# Patient Record
Sex: Female | Born: 1975 | Race: White | Hispanic: No | Marital: Married | Smoking: Never smoker
Health system: Southern US, Community
[De-identification: ages and names within clinical notes are randomized; demographics above are authoritative.]

## PROBLEM LIST (undated history)

## (undated) DIAGNOSIS — Z87440 Personal history of urinary (tract) infections: Secondary | ICD-10-CM

## (undated) DIAGNOSIS — C801 Malignant (primary) neoplasm, unspecified: Secondary | ICD-10-CM

## (undated) DIAGNOSIS — C439 Malignant melanoma of skin, unspecified: Secondary | ICD-10-CM

## (undated) HISTORY — DX: Personal history of urinary (tract) infections: Z87.440

## (undated) HISTORY — DX: Malignant (primary) neoplasm, unspecified: C80.1

---

## 1995-02-27 HISTORY — PX: WISDOM TOOTH EXTRACTION: SHX21

## 2005-10-17 ENCOUNTER — Inpatient Hospital Stay: Payer: Self-pay | Admitting: Obstetrics and Gynecology

## 2006-09-29 ENCOUNTER — Ambulatory Visit: Payer: Self-pay | Admitting: Emergency Medicine

## 2006-12-05 ENCOUNTER — Ambulatory Visit: Payer: Self-pay | Admitting: Internal Medicine

## 2007-02-27 HISTORY — PX: TONSILLECTOMY AND ADENOIDECTOMY: SHX28

## 2007-03-03 ENCOUNTER — Ambulatory Visit: Payer: Self-pay | Admitting: Otolaryngology

## 2008-02-27 DIAGNOSIS — C801 Malignant (primary) neoplasm, unspecified: Secondary | ICD-10-CM

## 2008-02-27 HISTORY — PX: EXCISION MELANOMA WITH SENTINEL LYMPH NODE BIOPSY: SHX5628

## 2008-02-27 HISTORY — DX: Malignant (primary) neoplasm, unspecified: C80.1

## 2008-08-16 DIAGNOSIS — C439 Malignant melanoma of skin, unspecified: Secondary | ICD-10-CM

## 2008-08-16 HISTORY — DX: Malignant melanoma of skin, unspecified: C43.9

## 2009-01-24 DIAGNOSIS — D239 Other benign neoplasm of skin, unspecified: Secondary | ICD-10-CM

## 2009-01-24 HISTORY — DX: Other benign neoplasm of skin, unspecified: D23.9

## 2009-09-07 DIAGNOSIS — D239 Other benign neoplasm of skin, unspecified: Secondary | ICD-10-CM

## 2009-09-07 HISTORY — DX: Other benign neoplasm of skin, unspecified: D23.9

## 2010-05-31 ENCOUNTER — Ambulatory Visit: Payer: Self-pay | Admitting: Surgery

## 2012-02-28 LAB — HM PAP SMEAR: HM PAP: NORMAL

## 2013-03-10 DIAGNOSIS — C4491 Basal cell carcinoma of skin, unspecified: Secondary | ICD-10-CM

## 2013-03-10 HISTORY — DX: Basal cell carcinoma of skin, unspecified: C44.91

## 2013-04-15 ENCOUNTER — Ambulatory Visit: Payer: Self-pay | Admitting: Physician Assistant

## 2014-06-02 ENCOUNTER — Encounter: Payer: Self-pay | Admitting: *Deleted

## 2014-09-28 ENCOUNTER — Ambulatory Visit (INDEPENDENT_AMBULATORY_CARE_PROVIDER_SITE_OTHER): Payer: 59 | Admitting: Nurse Practitioner

## 2014-09-28 ENCOUNTER — Encounter: Payer: Self-pay | Admitting: Nurse Practitioner

## 2014-09-28 ENCOUNTER — Encounter (INDEPENDENT_AMBULATORY_CARE_PROVIDER_SITE_OTHER): Payer: Self-pay

## 2014-09-28 VITALS — BP 110/68 | HR 73 | Temp 98.4°F | Resp 14 | Ht 63.5 in | Wt 157.8 lb

## 2014-09-28 DIAGNOSIS — Z85820 Personal history of malignant melanoma of skin: Secondary | ICD-10-CM

## 2014-09-28 DIAGNOSIS — Z7189 Other specified counseling: Secondary | ICD-10-CM

## 2014-09-28 DIAGNOSIS — Z0000 Encounter for general adult medical examination without abnormal findings: Secondary | ICD-10-CM | POA: Diagnosis not present

## 2014-09-28 DIAGNOSIS — Z7689 Persons encountering health services in other specified circumstances: Secondary | ICD-10-CM

## 2014-09-28 DIAGNOSIS — Z719 Counseling, unspecified: Secondary | ICD-10-CM | POA: Insufficient documentation

## 2014-09-28 NOTE — Assessment & Plan Note (Signed)
Will obtain records from previous facility

## 2014-09-28 NOTE — Assessment & Plan Note (Signed)
Discussed acute and chronic issues. Reviewed health maintenance measures, PFSHx, and immunizations. Obtain future fasting routine labs TSH, Lipid panel, CBC w/ diff, A1c, and CMET. Obtain records from previous facility.   No need for PAP today. Will obtain in 2017

## 2014-09-28 NOTE — Progress Notes (Signed)
Subjective:    Patient ID: Christine Petty, female    DOB: 1975/07/12, 38 y.o.   MRN: [id number]  HPI  Christine Petty is a 39 yo female establishing care today. No complaints.   1) New pt info:   Immunizations- Unknown dates, works at [employer], not keeping OB/GYN   LMP- 08/2014   Eye Exam- 2015 Dec.   Dental Exam- UTD  2) Chronic Problems-  Skin Cancer- 2010 melanoma, every 6 months check up with Dr. Nehemiah Massed   3) Acute Problems- No CC   Review of Systems  Constitutional: Negative for fever, chills, diaphoresis and fatigue.  Respiratory: Negative for chest tightness, shortness of breath and wheezing.   Cardiovascular: Negative for chest pain, palpitations and leg swelling.  Gastrointestinal: Negative for nausea, vomiting and diarrhea.  Skin: Negative for rash.  Neurological: Negative for dizziness, weakness, numbness and headaches.  Psychiatric/Behavioral: Negative for suicidal ideas and sleep disturbance. The patient is not nervous/anxious.    Past Medical History  Diagnosis Date  . Cancer 2010    Skin Cancer  . History of frequent urinary tract infections     History   Social History  . Marital Status: Married    Spouse Name: N/A  . Number of Children: N/A  . Years of Education: N/A   Occupational History  . Not on file.   Social History Main Topics  . Smoking status: Never Smoker   . Smokeless tobacco: Never Used  . Alcohol Use: 0.0 oz/week    0 Standard drinks or equivalent per week     Comment: Rare  . Drug Use: No  . Sexual Activity:    Partners: Male     Comment: Vasectomy- husband   Other Topics Concern  . Not on file   Social History Narrative   Works [employer] as an Therapist, sports    Lives with husband and son (77 yo)    Pets: 2 dogs live inside   Caffeine- 1 soda every other day                        Past Surgical History  Procedure Laterality Date  . Tonsillectomy and adenoidectomy  2009  . Excision melanoma with sentinel lymph node biopsy  2010    . Cesarean section  2007    Family History  Problem Relation Age of Onset  . Cancer Mother     Breast Cancer  . Hyperlipidemia Mother   . Hypertension Mother   . Hyperlipidemia Father   . Hypertension Father     No Known Allergies  No current outpatient prescriptions on file prior to visit.   No current facility-administered medications on file prior to visit.      Objective:   Physical Exam  Constitutional: She is oriented to person, place, and time. She appears well-developed and well-nourished. No distress.  BP 110/68 mmHg  Pulse 73  Temp(Src) 98.4 F (36.9 C)  Resp 14  Ht 5' 3.5" (1.613 m)  Wt 157 lb 12.8 oz (71.578 kg)  BMI 27.51 kg/m2  SpO2 97%   HENT:  Head: Normocephalic and atraumatic.  Right Ear: External ear normal.  Left Ear: External ear normal.  Cardiovascular: Normal rate, regular rhythm, normal heart sounds and intact distal pulses.  Exam reveals no gallop and no friction rub.   No murmur heard. Pulmonary/Chest: Effort normal and breath sounds normal. No respiratory distress. She has no wheezes. She has no rales. She exhibits no  tenderness.  Neurological: She is alert and oriented to person, place, and time. No cranial nerve deficit. She exhibits normal muscle tone. Coordination normal.  Skin: Skin is warm and dry. No rash noted. She is not diaphoretic.  Psychiatric: She has a normal mood and affect. Her behavior is normal. Judgment and thought content normal.      Assessment & Plan:

## 2014-09-28 NOTE — Patient Instructions (Addendum)
Welcome to [facility]! Nice to meet you!   Please make a fasting lab appointment before leaving today.   Follow up in 1 year or sooner as needed.

## 2014-09-28 NOTE — Addendum Note (Signed)
Addended by: Rubbie Battiest on: 09/28/2014 04:56 PM   Modules accepted: Level of Service

## 2014-09-28 NOTE — Assessment & Plan Note (Signed)
Sees Dermatology- Dr. Nehemiah Massed every 6 months. No recent concerns. Will follow.

## 2014-09-28 NOTE — Progress Notes (Signed)
Pre visit review using our clinic review tool, if applicable. No additional management support is needed unless otherwise documented below in the visit note. 

## 2014-10-08 ENCOUNTER — Other Ambulatory Visit (INDEPENDENT_AMBULATORY_CARE_PROVIDER_SITE_OTHER): Payer: 59

## 2014-10-08 DIAGNOSIS — Z0000 Encounter for general adult medical examination without abnormal findings: Secondary | ICD-10-CM

## 2014-10-08 LAB — HEMOGLOBIN A1C: Hgb A1c MFr Bld: 5.1 % (ref 4.6–6.5)

## 2014-10-08 LAB — CBC WITH DIFFERENTIAL/PLATELET
BASOS PCT: 0.5 % (ref 0.0–3.0)
Basophils Absolute: 0 10*3/uL (ref 0.0–0.1)
EOS ABS: 0 10*3/uL (ref 0.0–0.7)
Eosinophils Relative: 0.6 % (ref 0.0–5.0)
HCT: 38.8 % (ref 36.0–46.0)
Hemoglobin: 13.2 g/dL (ref 12.0–15.0)
LYMPHS ABS: 2.2 10*3/uL (ref 0.7–4.0)
Lymphocytes Relative: 33.1 % (ref 12.0–46.0)
MCHC: 34.1 g/dL (ref 30.0–36.0)
MCV: 95.3 fl (ref 78.0–100.0)
MONO ABS: 0.4 10*3/uL (ref 0.1–1.0)
Monocytes Relative: 6 % (ref 3.0–12.0)
NEUTROS ABS: 4 10*3/uL (ref 1.4–7.7)
Neutrophils Relative %: 59.8 % (ref 43.0–77.0)
Platelets: 277 10*3/uL (ref 150.0–400.0)
RBC: 4.07 Mil/uL (ref 3.87–5.11)
RDW: 12.7 % (ref 11.5–15.5)
WBC: 6.7 10*3/uL (ref 4.0–10.5)

## 2014-10-08 LAB — COMPREHENSIVE METABOLIC PANEL
ALBUMIN: 4.3 g/dL (ref 3.5–5.2)
ALK PHOS: 47 U/L (ref 39–117)
ALT: 11 U/L (ref 0–35)
AST: 18 U/L (ref 0–37)
BILIRUBIN TOTAL: 2.1 mg/dL — AB (ref 0.2–1.2)
BUN: 16 mg/dL (ref 6–23)
CALCIUM: 9.3 mg/dL (ref 8.4–10.5)
CO2: 25 mEq/L (ref 19–32)
CREATININE: 0.65 mg/dL (ref 0.40–1.20)
Chloride: 105 mEq/L (ref 96–112)
GFR: 107.62 mL/min (ref >60.00)
Glucose, Bld: 86 mg/dL (ref 70–99)
Potassium: 4.4 mEq/L (ref 3.5–5.1)
Sodium: 138 mEq/L (ref 135–145)
Total Protein: 7.2 g/dL (ref 6.0–8.3)

## 2014-10-08 LAB — LIPID PANEL
Cholesterol: 186 mg/dL (ref 0–200)
HDL: 59.7 mg/dL (ref >39.00)
LDL Cholesterol: 115 mg/dL — ABNORMAL HIGH (ref 0–99)
NONHDL: 125.98
Total CHOL/HDL Ratio: 3
Triglycerides: 57 mg/dL (ref 0.0–149.0)
VLDL: 11.4 mg/dL (ref 0.0–40.0)

## 2014-10-08 LAB — TSH: TSH: 1.09 u[IU]/mL (ref 0.35–4.50)

## 2015-05-25 DIAGNOSIS — D485 Neoplasm of uncertain behavior of skin: Secondary | ICD-10-CM | POA: Diagnosis not present

## 2015-05-25 DIAGNOSIS — L820 Inflamed seborrheic keratosis: Secondary | ICD-10-CM | POA: Diagnosis not present

## 2015-05-25 DIAGNOSIS — D225 Melanocytic nevi of trunk: Secondary | ICD-10-CM | POA: Diagnosis not present

## 2015-05-25 DIAGNOSIS — L309 Dermatitis, unspecified: Secondary | ICD-10-CM | POA: Diagnosis not present

## 2015-05-25 DIAGNOSIS — Z85820 Personal history of malignant melanoma of skin: Secondary | ICD-10-CM | POA: Diagnosis not present

## 2015-05-25 DIAGNOSIS — Z1283 Encounter for screening for malignant neoplasm of skin: Secondary | ICD-10-CM | POA: Diagnosis not present

## 2015-05-25 DIAGNOSIS — Z85828 Personal history of other malignant neoplasm of skin: Secondary | ICD-10-CM | POA: Diagnosis not present

## 2015-11-04 ENCOUNTER — Ambulatory Visit
Admission: EM | Admit: 2015-11-04 | Discharge: 2015-11-04 | Disposition: A | Discharge status: Home / Self Care | Payer: 59 | Attending: Family Medicine | Admitting: Family Medicine

## 2015-11-04 DIAGNOSIS — H109 Unspecified conjunctivitis: Secondary | ICD-10-CM | POA: Diagnosis not present

## 2015-11-04 HISTORY — DX: Malignant melanoma of skin, unspecified: C43.9

## 2015-11-04 MED ORDER — KETOTIFEN FUMARATE 0.025 % OP SOLN: 1.0000 [drp] | Freq: Two times a day (BID) | OPHTHALMIC | 0 refills | Status: AC

## 2015-11-04 MED ORDER — MOXIFLOXACIN HCL 0.5 % OP SOLN: 1.0000 [drp] | Freq: Three times a day (TID) | OPHTHALMIC | 0 refills | Status: DC

## 2015-11-04 NOTE — ED Triage Notes (Signed)
Left eye 20/25 corrected Right eye 20/25 corrected Both eyes 20/20 corrected

## 2015-11-04 NOTE — ED Provider Notes (Signed)
CSN: [id number]     Arrival date & time 11/04/15  [id number] History   First MD Initiated Contact with Patient 11/04/15 [phone]     Chief Complaint  Patient presents with  . 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 She does wear contact lenses. He has a feeling of grittiness in her eye and has had a clear type discharge. Visual disturbances and has not have any photosensitivity. She does not exhibit squinting. States that she did not wear her contacts this morning and is wearing normal eyewear. He states that she removes her contacts nightly and cleans them every single night without fail.      Past Medical History:  Diagnosis Date  . Cancer ([place]) 2010   Skin Cancer  . History of frequent urinary tract infections   . Melanoma [facility])    Past Surgical History:  Procedure Laterality Date  . CESAREAN SECTION  2007  . EXCISION MELANOMA WITH SENTINEL LYMPH NODE BIOPSY  2010  . TONSILLECTOMY AND ADENOIDECTOMY  2009   Family History  Problem Relation Age of Onset  . Cancer Mother     Breast Cancer  . Hyperlipidemia Mother   . Hypertension Mother   . Hyperlipidemia Father   . Hypertension Father    Social History  Substance Use Topics  . Smoking status: Never Smoker  . Smokeless tobacco: Never Used  . Alcohol use 0.0 oz/week     Comment: Rare   OB History    No data available     Review of Systems  Constitutional: Negative for activity change, chills, fatigue and fever.  HENT: Negative for congestion.   Eyes: Positive for discharge, redness and itching. Negative for photophobia and visual disturbance.  All other systems reviewed and are negative.   Allergies  Review of patient's allergies indicates no known allergies.  Home Medications   Prior to Admission medications   Medication Sig  Start Date End Date Taking? Authorizing Provider  ketotifen (ZADITOR) 0.025 % ophthalmic solution Place 1 drop into both eyes 2 (two) times daily. 11/04/15 11/11/15  Lorin Picket, PA-C  moxifloxacin (VIGAMOX) 0.5 % ophthalmic solution Place 1 drop into the right eye 3 (three) times daily. 11/04/15   Lorin Picket, PA-C   Meds Ordered and Administered this Visit  Medications - No data to display  BP (!) 109/59 (BP Location: Left Arm)   Pulse 68   Temp 98.7 F (37.1 C) (Oral)   Resp 16   Ht 5\' 3"  (1.6 m)   Wt 155 lb (70.3 kg)   LMP 10/24/2015   SpO2 99%   BMI 27.46 kg/m  No data found.   Physical Exam  Constitutional: She is oriented to person, place, and time. She appears well-developed and well-nourished. No distress.  HENT:  Head: Normocephalic and atraumatic.  Eyes: EOM are normal. Pupils are equal, round, and reactive to light. Right eye exhibits discharge. Left eye exhibits no discharge. No scleral icterus.  Examination of the eyes shows visual acuity to be normal with correction. Pupils were equal and reactive to light. EOMs are full and intact. The right eye conjunctiva is inflamed. She has mild amount of clear discharge present. The right eyelid was everted with the findings of no foreign bodies. With her permission of tetracaine was utilized to anesthetize the eye and fluorscein stain was placed. Clamp was  then utilized to examine the eye showing no apparent corneal abrasions.  Musculoskeletal: Normal range of motion.  Neurological: She is alert and oriented to person, place, and time.  Skin: Skin is warm and dry. She is not diaphoretic.  Psychiatric: She has a normal mood and affect. Her behavior is normal. Judgment and thought content normal.  Nursing note and vitals reviewed.   Urgent Care Course   Clinical Course    Procedures (including critical care time)  Labs Review Labs Reviewed - No data to display  Imaging Review No results found.   Visual Acuity  Review  Right Eye Distance:  (20/25 corrected) Left Eye Distance:  (20/25 corrected) Bilateral Distance:  (20/20 corrected)  Right Eye Near:   Left Eye Near:    Bilateral Near:         MDM   1. Conjunctivitis of right eye    Discharge Medication List as of 11/04/2015  9:11 AM    START taking these medications   Details  ketotifen (ZADITOR) 0.025 % ophthalmic solution Place 1 drop into both eyes 2 (two) times daily., Starting Fri 11/04/2015, Until Fri 11/11/2015, Normal    moxifloxacin (VIGAMOX) 0.5 % ophthalmic solution Place 1 drop into the right eye 3 (three) times daily., Starting Fri 11/04/2015, Normal      Plan: 1. Test/x-ray results and diagnosis reviewed with patient 2. rx as per orders; risks, benefits, potential side effects reviewed with patient 3. Recommend supportive treatment with cool Compresses for comfort. I recommended not using contacts for the next 7 days. She's not improved in 2 days she should see the ophthalmologist at [facility]. Because of her contact eyewear usage I have placed her on antibacterial eyedrops along with antihistamine/ mast cell stabilizer eyedrops. Is given a work note for today. 4. F/u prn if symptoms worsen or don't improve     Lorin Picket, PA-C 11/04/15 [phone]

## 2015-11-04 NOTE — ED Triage Notes (Signed)
Pt reports right eye sclera redness and burning starting last night. Clear drainage from eye and feels "gritty"

## 2015-11-28 DIAGNOSIS — Z85828 Personal history of other malignant neoplasm of skin: Secondary | ICD-10-CM | POA: Diagnosis not present

## 2015-11-28 DIAGNOSIS — D485 Neoplasm of uncertain behavior of skin: Secondary | ICD-10-CM | POA: Diagnosis not present

## 2015-11-28 DIAGNOSIS — Z85820 Personal history of malignant melanoma of skin: Secondary | ICD-10-CM | POA: Diagnosis not present

## 2015-11-28 DIAGNOSIS — L820 Inflamed seborrheic keratosis: Secondary | ICD-10-CM | POA: Diagnosis not present

## 2015-11-28 DIAGNOSIS — L578 Other skin changes due to chronic exposure to nonionizing radiation: Secondary | ICD-10-CM | POA: Diagnosis not present

## 2015-11-28 DIAGNOSIS — L812 Freckles: Secondary | ICD-10-CM | POA: Diagnosis not present

## 2015-11-28 DIAGNOSIS — D225 Melanocytic nevi of trunk: Secondary | ICD-10-CM | POA: Diagnosis not present

## 2015-11-28 DIAGNOSIS — L309 Dermatitis, unspecified: Secondary | ICD-10-CM | POA: Diagnosis not present

## 2015-11-28 DIAGNOSIS — D2271 Melanocytic nevi of right lower limb, including hip: Secondary | ICD-10-CM | POA: Diagnosis not present

## 2015-11-28 DIAGNOSIS — Z1283 Encounter for screening for malignant neoplasm of skin: Secondary | ICD-10-CM | POA: Diagnosis not present

## 2015-11-28 DIAGNOSIS — D229 Melanocytic nevi, unspecified: Secondary | ICD-10-CM | POA: Diagnosis not present

## 2015-12-12 ENCOUNTER — Other Ambulatory Visit (HOSPITAL_COMMUNITY)
Admission: RE | Admit: 2015-12-12 | Discharge: 2015-12-12 | Disposition: A | Discharge status: Home / Self Care | Payer: 59 | Source: Ambulatory Visit | Attending: Family | Admitting: Family

## 2015-12-12 ENCOUNTER — Encounter: Payer: Self-pay | Admitting: Family

## 2015-12-12 ENCOUNTER — Ambulatory Visit (INDEPENDENT_AMBULATORY_CARE_PROVIDER_SITE_OTHER): Payer: 59 | Admitting: Family

## 2015-12-12 VITALS — BP 130/88 | HR 76 | Ht 63.25 in | Wt 155.0 lb

## 2015-12-12 DIAGNOSIS — Z0000 Encounter for general adult medical examination without abnormal findings: Secondary | ICD-10-CM | POA: Diagnosis not present

## 2015-12-12 DIAGNOSIS — Z85820 Personal history of malignant melanoma of skin: Secondary | ICD-10-CM

## 2015-12-12 DIAGNOSIS — Z1151 Encounter for screening for human papillomavirus (HPV): Secondary | ICD-10-CM | POA: Diagnosis not present

## 2015-12-12 DIAGNOSIS — Z01411 Encounter for gynecological examination (general) (routine) with abnormal findings: Secondary | ICD-10-CM | POA: Insufficient documentation

## 2015-12-12 LAB — COMPREHENSIVE METABOLIC PANEL
ALBUMIN: 4.3 g/dL (ref 3.5–5.2)
ALT: 12 U/L (ref 0–35)
AST: 16 U/L (ref 0–37)
Alkaline Phosphatase: 43 U/L (ref 39–117)
BUN: 15 mg/dL (ref 6–23)
CALCIUM: 9.5 mg/dL (ref 8.4–10.5)
CHLORIDE: 107 meq/L (ref 96–112)
CO2: 27 meq/L (ref 19–32)
CREATININE: 0.83 mg/dL (ref 0.40–1.20)
GFR: 80.68 mL/min (ref >60.00)
Glucose, Bld: 94 mg/dL (ref 70–99)
POTASSIUM: 4.2 meq/L (ref 3.5–5.1)
SODIUM: 139 meq/L (ref 135–145)
Total Bilirubin: 0.9 mg/dL (ref 0.2–1.2)
Total Protein: 7.2 g/dL (ref 6.0–8.3)

## 2015-12-12 LAB — CBC WITH DIFFERENTIAL/PLATELET
BASOS PCT: 0.5 % (ref 0.0–3.0)
Basophils Absolute: 0 10*3/uL (ref 0.0–0.1)
EOS ABS: 0.1 10*3/uL (ref 0.0–0.7)
EOS PCT: 0.8 % (ref 0.0–5.0)
HCT: 38.4 % (ref 36.0–46.0)
Hemoglobin: 13.1 g/dL (ref 12.0–15.0)
Lymphocytes Relative: 30 % (ref 12.0–46.0)
Lymphs Abs: 2.2 10*3/uL (ref 0.7–4.0)
MCHC: 34.2 g/dL (ref 30.0–36.0)
MCV: 95.3 fl (ref 78.0–100.0)
MONO ABS: 0.5 10*3/uL (ref 0.1–1.0)
Monocytes Relative: 7.2 % (ref 3.0–12.0)
NEUTROS ABS: 4.4 10*3/uL (ref 1.4–7.7)
Neutrophils Relative %: 61.5 % (ref 43.0–77.0)
PLATELETS: 270 10*3/uL (ref 150.0–400.0)
RBC: 4.03 Mil/uL (ref 3.87–5.11)
RDW: 12.9 % (ref 11.5–15.5)
WBC: 7.2 10*3/uL (ref 4.0–10.5)

## 2015-12-12 LAB — LIPID PANEL
CHOLESTEROL: 163 mg/dL (ref 0–200)
HDL: 52.2 mg/dL (ref >39.00)
LDL CALC: 94 mg/dL (ref 0–99)
NonHDL: 110.44
Total CHOL/HDL Ratio: 3
Triglycerides: 82 mg/dL (ref 0.0–149.0)
VLDL: 16.4 mg/dL (ref 0.0–40.0)

## 2015-12-12 LAB — VITAMIN D 25 HYDROXY (VIT D DEFICIENCY, FRACTURES): VITD: 19.06 ng/mL — AB (ref 30.00–100.00)

## 2015-12-12 LAB — HEMOGLOBIN A1C: HEMOGLOBIN A1C: 4.9 % (ref 4.6–6.5)

## 2015-12-12 LAB — TSH: TSH: 1.29 u[IU]/mL (ref 0.35–4.50)

## 2015-12-12 NOTE — Progress Notes (Signed)
Subjective:    Patient ID: Christine Petty, female    DOB: 1975-04-02, 40 y.o.   MRN: [id number]  CC: Christine Petty is a 40 y.o. female who presents today for physical exam.    HPI: Patient here to establish care and for routine physical. Feeling well.  History of melanoma, follows with Dr. Nehemiah Massed  Every 6 months.        Colorectal Cancer Screening: No strong family history.Will defer at this time.  Breast Cancer Screening: Due Cervical Cancer Screening: Due.  Bone Health screening/DEXA for 65+: No increased fracture risk. Defer screening at this time.  Immunizations- no records. Will do Tdap at work.        Tetanus - Due        HIV Screening- Candidate for.  Labs: Screening labs today. Exercise: Gets regular exercise.  Alcohol use:  Rare Smoking/tobacco use: Nonsmoker.  Regular dental exams: UTD Wears seat belt: Yes.  HISTORY:  Past Medical History:  Diagnosis Date  . Cancer ([place]) 2010   Skin Cancer  . History of frequent urinary tract infections   . Melanoma [facility])     Past Surgical History:  Procedure Laterality Date  . CESAREAN SECTION  2007  . EXCISION MELANOMA WITH SENTINEL LYMPH NODE BIOPSY  2010  . TONSILLECTOMY AND ADENOIDECTOMY  2009   Family History  Problem Relation Age of Onset  . Cancer Mother 34    Breast Cancer  . Hyperlipidemia Mother   . Hypertension Mother   . Hyperlipidemia Father   . Hypertension Father       ALLERGIES: Review of patient's allergies indicates no known allergies.  No current outpatient prescriptions on file prior to visit.   No current facility-administered medications on file prior to visit.     Social History  Substance Use Topics  . Smoking status: Never Smoker  . Smokeless tobacco: Never Used  . Alcohol use 0.0 oz/week     Comment: Rare    Review of Systems  Constitutional: Negative for chills, fever and unexpected weight change.  HENT: Negative for congestion.   Respiratory: Negative for cough.     Cardiovascular: Negative for chest pain, palpitations and leg swelling.  Gastrointestinal: Negative for nausea and vomiting.  Musculoskeletal: Negative for arthralgias and myalgias.  Skin: Negative for rash.  Neurological: Negative for headaches.  Hematological: Negative for adenopathy.  Psychiatric/Behavioral: Negative for confusion.      Objective:    BP 130/88   Pulse 76   Ht 5' 3.25" (1.607 m)   Wt 155 lb (70.3 kg)   SpO2 99%   BMI 27.24 kg/m   BP Readings from Last 3 Encounters:  12/12/15 130/88  11/04/15 (!) 109/59  09/28/14 110/68   Wt Readings from Last 3 Encounters:  12/12/15 155 lb (70.3 kg)  11/04/15 155 lb (70.3 kg)  09/28/14 157 lb 12.8 oz (71.6 kg)    Physical Exam  Constitutional: She appears well-developed and well-nourished.  Eyes: Conjunctivae are normal.  Neck: No thyroid mass and no thyromegaly present.    Scar noted.  Cardiovascular: Normal rate, regular rhythm, normal heart sounds and normal pulses.   Pulmonary/Chest: Effort normal and breath sounds normal. She has no wheezes. She has no rhonchi. She has no rales. Right breast exhibits no inverted nipple, no mass, no nipple discharge, no skin change and no tenderness. Left breast exhibits no inverted nipple, no mass, no nipple discharge, no skin change and no tenderness. Breasts are symmetrical.  No masses  or asymmetry appreciated during CBE.  Genitourinary: Uterus is not enlarged, not fixed and not tender. Cervix exhibits no motion tenderness, no discharge and no friability. Right adnexum displays no mass, no tenderness and no fullness. Left adnexum displays no mass, no tenderness and no fullness.  Genitourinary Comments: Pap performed. No CMT. Unable to appreciated ovaries.  Lymphadenopathy:       Head (right side): No submental, no submandibular, no tonsillar, no preauricular, no posterior auricular and no occipital adenopathy present.       Head (left side): No submental, no submandibular, no  tonsillar, no preauricular, no posterior auricular and no occipital adenopathy present.       Right cervical: No superficial cervical, no deep cervical and no posterior cervical adenopathy present.      Left cervical: No superficial cervical, no deep cervical and no posterior cervical adenopathy present.    She has no axillary adenopathy.       Right axillary: No pectoral and no lateral adenopathy present.       Left axillary: No pectoral and no lateral adenopathy present. Neurological: She is alert.  Skin: Skin is warm and dry.  Psychiatric: She has a normal mood and affect. Her speech is normal and behavior is normal. Thought content normal.  Vitals reviewed.      Assessment & Plan:   Problem List Items Addressed This Visit      Other   Routine general medical examination at a health care facility - Primary    Deferred colonoscopy, DEXA due to age and absence of risk factors. Ordered mammogram. Pap and CBE performed. Screening labs ordered including HIV. 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 Encouraged healthy diet and exercise.       Relevant Orders   CBC with Differential/Platelet (Completed)   Comprehensive metabolic panel (Completed)   Hemoglobin A1c (Completed)   Lipid panel (Completed)   Cytology - PAP   VITAMIN D 25 Hydroxy (Vit-D Deficiency, Fractures) (Completed)   TSH (Completed)   MM DIGITAL SCREENING BILATERAL   HIV antibody   History of melanoma    Stable. Follows with Dr. Broadus John.        Other Visit Diagnoses   None.      I have discontinued Ms. Mom's moxifloxacin.   No orders of the defined types were placed in this encounter.   Return precautions given.   Risks, benefits, and alternatives of the medications and treatment plan prescribed today were discussed, and patient expressed understanding.   Education regarding symptom management and diagnosis given to patient on AVS.   Continue to follow with Mable Paris, FNP  for routine health maintenance.   Christine Petty and I agreed with plan.   Mable Paris, FNP

## 2015-12-12 NOTE — Assessment & Plan Note (Signed)
Deferred colonoscopy, DEXA due to age and absence of risk factors. Ordered mammogram. Pap and CBE performed. Screening labs ordered including HIV. 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 Encouraged healthy diet and exercise.

## 2015-12-12 NOTE — Assessment & Plan Note (Signed)
Stable. Follows with Dr. Broadus John.

## 2015-12-12 NOTE — Patient Instructions (Addendum)
Pleasure seeing you.   Health Maintenance, Female Adopting a healthy lifestyle and getting preventive care can go a long way to promote health and wellness. Talk with your health care provider about what schedule of regular examinations is right for you. This is a good chance for you to check in with your provider about disease prevention and staying healthy. In between checkups, there are plenty of things you can do on your own. Experts have done a lot of research about which lifestyle changes and preventive measures are most likely to keep you healthy. Ask your health care provider for more information. WEIGHT AND DIET  Eat a healthy diet  Be sure to include plenty of vegetables, fruits, low-fat dairy products, and lean protein.  Do not eat a lot of foods high in solid fats, added sugars, or salt.  Get regular exercise. This is one of the most important things you can do for your health.  Most adults should exercise for at least 150 minutes each week. The exercise should increase your heart rate and make you sweat (moderate-intensity exercise).  Most adults should also do strengthening exercises at least twice a week. This is in addition to the moderate-intensity exercise.  Maintain a healthy weight  Body mass index (BMI) is a measurement that can be used to identify possible weight problems. It estimates body fat based on height and weight. Your health care provider can help determine your BMI and help you achieve or maintain a healthy weight.  For females 86 years of age and older:   A BMI below 18.5 is considered underweight.  A BMI of 18.5 to 24.9 is normal.  A BMI of 25 to 29.9 is considered overweight.  A BMI of 30 and above is considered obese.  Watch levels of cholesterol and blood lipids  You should start having your blood tested for lipids and cholesterol at 40 years of age, then have this test every 5 years.  You may need to have your cholesterol levels checked more  often if:  Your lipid or cholesterol levels are high.  You are older than 40 years of age.  You are at high risk for heart disease.  CANCER SCREENING   Lung Cancer  Lung cancer screening is recommended for adults 22-52 years old who are at high risk for lung cancer because of a history of smoking.  A yearly low-dose CT scan of the lungs is recommended for people who:  Currently smoke.  Have quit within the past 15 years.  Have at least a 30-pack-year history of smoking. A pack year is smoking an average of one pack of cigarettes a day for 1 year.  Yearly screening should continue until it has been 15 years since you quit.  Yearly screening should stop if you develop a health problem that would prevent you from having lung cancer treatment.  Breast Cancer  Practice breast self-awareness. This means understanding how your breasts normally appear and feel.  It also means doing regular breast self-exams. Let your health care provider know about any changes, no matter how small.  If you are in your 20s or 30s, you should have a clinical breast exam (CBE) by a health care provider every 1-3 years as part of a regular health exam.  If you are 58 or older, have a CBE every year. Also consider having a breast X-ray (mammogram) every year.  If you have a family history of breast cancer, talk to your health care provider about  genetic screening.  If you are at high risk for breast cancer, talk to your health care provider about having an MRI and a mammogram every year.  Breast cancer gene (BRCA) assessment is recommended for women who have family members with BRCA-related cancers. BRCA-related cancers include:  Breast.  Ovarian.  Tubal.  Peritoneal cancers.  Results of the assessment will determine the need for genetic counseling and BRCA1 and BRCA2 testing. Cervical Cancer Your health care provider may recommend that you be screened regularly for cancer of the pelvic organs  (ovaries, uterus, and vagina). This screening involves a pelvic examination, including checking for microscopic changes to the surface of your cervix (Pap test). You may be encouraged to have this screening done every 3 years, beginning at age 21.  For women ages 30-65, health care providers may recommend pelvic exams and Pap testing every 3 years, or they may recommend the Pap and pelvic exam, combined with testing for human papilloma virus (HPV), every 5 years. Some types of HPV increase your risk of cervical cancer. Testing for HPV may also be done on women of any age with unclear Pap test results.  Other health care providers may not recommend any screening for nonpregnant women who are considered low risk for pelvic cancer and who do not have symptoms. Ask your health care provider if a screening pelvic exam is right for you.  If you have had past treatment for cervical cancer or a condition that could lead to cancer, you need Pap tests and screening for cancer for at least 20 years after your treatment. If Pap tests have been discontinued, your risk factors (such as having a new sexual partner) need to be reassessed to determine if screening should resume. Some women have medical problems that increase the chance of getting cervical cancer. In these cases, your health care provider may recommend more frequent screening and Pap tests. Colorectal Cancer  This type of cancer can be detected and often prevented.  Routine colorectal cancer screening usually begins at 40 years of age and continues through 40 years of age.  Your health care provider may recommend screening at an earlier age if you have risk factors for colon cancer.  Your health care provider may also recommend using home test kits to check for hidden blood in the stool.  A small camera at the end of a tube can be used to examine your colon directly (sigmoidoscopy or colonoscopy). This is done to check for the earliest forms of  colorectal cancer.  Routine screening usually begins at age 50.  Direct examination of the colon should be repeated every 5-10 years through 40 years of age. However, you may need to be screened more often if early forms of precancerous polyps or small growths are found. Skin Cancer  Check your skin from head to toe regularly.  Tell your health care provider about any new moles or changes in moles, especially if there is a change in a mole's shape or color.  Also tell your health care provider if you have a mole that is larger than the size of a pencil eraser.  Always use sunscreen. Apply sunscreen liberally and repeatedly throughout the day.  Protect yourself by wearing long sleeves, pants, a wide-brimmed hat, and sunglasses whenever you are outside. HEART DISEASE, DIABETES, AND HIGH BLOOD PRESSURE   High blood pressure causes heart disease and increases the risk of stroke. High blood pressure is more likely to develop in:  People who have blood   pressure in the high end of the normal range (130-139/85-89 mm Hg).  People who are overweight or obese.  People who are African American.  If you are 18-39 years of age, have your blood pressure checked every 3-5 years. If you are 40 years of age or older, have your blood pressure checked every year. You should have your blood pressure measured twice--once when you are at a hospital or clinic, and once when you are not at a hospital or clinic. Record the average of the two measurements. To check your blood pressure when you are not at a hospital or clinic, you can use:  An automated blood pressure machine at a pharmacy.  A home blood pressure monitor.  If you are between 55 years and 79 years old, ask your health care provider if you should take aspirin to prevent strokes.  Have regular diabetes screenings. This involves taking a blood sample to check your fasting blood sugar level.  If you are at a normal weight and have a low risk for  diabetes, have this test once every three years after 40 years of age.  If you are overweight and have a high risk for diabetes, consider being tested at a younger age or more often. PREVENTING INFECTION  Hepatitis B  If you have a higher risk for hepatitis B, you should be screened for this virus. You are considered at high risk for hepatitis B if:  You were born in a country where hepatitis B is common. Ask your health care provider which countries are considered high risk.  Your parents were born in a high-risk country, and you have not been immunized against hepatitis B (hepatitis B vaccine).  You have HIV or AIDS.  You use needles to inject street drugs.  You live with someone who has hepatitis B.  You have had sex with someone who has hepatitis B.  You get hemodialysis treatment.  You take certain medicines for conditions, including cancer, organ transplantation, and autoimmune conditions. Hepatitis C  Blood testing is recommended for:  Everyone born from 1945 through 1965.  Anyone with known risk factors for hepatitis C. Sexually transmitted infections (STIs)  You should be screened for sexually transmitted infections (STIs) including gonorrhea and chlamydia if:  You are sexually active and are younger than 40 years of age.  You are older than 40 years of age and your health care provider tells you that you are at risk for this type of infection.  Your sexual activity has changed since you were last screened and you are at an increased risk for chlamydia or gonorrhea. Ask your health care provider if you are at risk.  If you do not have HIV, but are at risk, it may be recommended that you take a prescription medicine daily to prevent HIV infection. This is called pre-exposure prophylaxis (PrEP). You are considered at risk if:  You are sexually active and do not regularly use condoms or know the HIV status of your partner(s).  You take drugs by injection.  You are  sexually active with a partner who has HIV. Talk with your health care provider about whether you are at high risk of being infected with HIV. If you choose to begin PrEP, you should first be tested for HIV. You should then be tested every 3 months for as long as you are taking PrEP.  PREGNANCY   If you are premenopausal and you may become pregnant, ask your health care provider about preconception   counseling.  If you may become pregnant, take 400 to 800 micrograms (mcg) of folic acid every day.  If you want to prevent pregnancy, talk to your health care provider about birth control (contraception). OSTEOPOROSIS AND MENOPAUSE   Osteoporosis is a disease in which the bones lose minerals and strength with aging. This can result in serious bone fractures. Your risk for osteoporosis can be identified using a bone density scan.  If you are 54 years of age or older, or if you are at risk for osteoporosis and fractures, ask your health care provider if you should be screened.  Ask your health care provider whether you should take a calcium or vitamin D supplement to lower your risk for osteoporosis.  Menopause may have certain physical symptoms and risks.  Hormone replacement therapy may reduce some of these symptoms and risks. Talk to your health care provider about whether hormone replacement therapy is right for you.  HOME CARE INSTRUCTIONS   Schedule regular health, dental, and eye exams.  Stay current with your immunizations.   Do not use any tobacco products including cigarettes, chewing tobacco, or electronic cigarettes.  If you are pregnant, do not drink alcohol.  If you are breastfeeding, limit how much and how often you drink alcohol.  Limit alcohol intake to no more than 1 drink per day for nonpregnant women. One drink equals 12 ounces of beer, 5 ounces of wine, or 1 ounces of hard liquor.  Do not use street drugs.  Do not share needles.  Ask your health care provider for  help if you need support or information about quitting drugs.  Tell your health care provider if you often feel depressed.  Tell your health care provider if you have ever been abused or do not feel safe at home.   This information is not intended to replace advice given to you by your health care provider. Make sure you discuss any questions you have with your health care provider.   Document Released: 08/28/2010 Document Revised: 03/05/2014 Document Reviewed: 01/14/2013 Elsevier Interactive Patient Education [employer].

## 2015-12-13 ENCOUNTER — Encounter: Payer: Self-pay | Admitting: Family

## 2015-12-13 LAB — CYTOLOGY - PAP
DIAGNOSIS: NEGATIVE
HPV (WINDOPATH): NOT DETECTED

## 2015-12-13 LAB — HM PAP SMEAR: HM PAP: NORMAL

## 2015-12-22 ENCOUNTER — Ambulatory Visit: Admission: RE | Admit: 2015-12-22 | Payer: 59 | Source: Ambulatory Visit

## 2016-01-02 ENCOUNTER — Other Ambulatory Visit: Payer: Self-pay | Admitting: Family

## 2016-01-02 ENCOUNTER — Ambulatory Visit
Admission: RE | Admit: 2016-01-02 | Discharge: 2016-01-02 | Disposition: A | Discharge status: Home / Self Care | Payer: 59 | Source: Ambulatory Visit | Attending: Family | Admitting: Family

## 2016-01-02 DIAGNOSIS — Z0000 Encounter for general adult medical examination without abnormal findings: Secondary | ICD-10-CM

## 2016-01-02 DIAGNOSIS — Z1231 Encounter for screening mammogram for malignant neoplasm of breast: Secondary | ICD-10-CM | POA: Insufficient documentation

## 2016-01-25 ENCOUNTER — Ambulatory Visit: Payer: Self-pay | Admitting: Physician Assistant

## 2016-01-25 ENCOUNTER — Encounter: Payer: Self-pay | Admitting: Physician Assistant

## 2016-01-25 VITALS — BP 102/70 | HR 80 | Temp 98.4°F

## 2016-01-25 DIAGNOSIS — R319 Hematuria, unspecified: Secondary | ICD-10-CM

## 2016-01-25 DIAGNOSIS — N390 Urinary tract infection, site not specified: Secondary | ICD-10-CM

## 2016-01-25 DIAGNOSIS — R300 Dysuria: Secondary | ICD-10-CM

## 2016-01-25 LAB — POCT URINALYSIS DIPSTICK
Bilirubin, UA: NEGATIVE
Glucose, UA: NEGATIVE
KETONES UA: NEGATIVE
NITRITE UA: NEGATIVE
PH UA: 7
PROTEIN UA: NEGATIVE
Spec Grav, UA: 1.015
Urobilinogen, UA: 0.2

## 2016-01-25 MED ORDER — PHENAZOPYRIDINE HCL 200 MG PO TABS: 200.0000 mg | ORAL_TABLET | Freq: Three times a day (TID) | ORAL | 0 refills | Status: DC | PRN

## 2016-01-25 MED ORDER — FLUCONAZOLE 150 MG PO TABS: 150.0000 mg | ORAL_TABLET | Freq: Once | ORAL | 0 refills | Status: AC

## 2016-01-25 MED ORDER — CIPROFLOXACIN HCL 250 MG PO TABS: 250.0000 mg | ORAL_TABLET | Freq: Two times a day (BID) | ORAL | 0 refills | Status: DC

## 2016-01-25 NOTE — Progress Notes (Signed)
S:  C/o uti sx for 1 days, burning, urgency, frequency, denies vaginal discharge, abdominal pain or flank pain, fever/chills/v/d  Remainder ros neg  O:  Vitals wnl, nad, no cva tenderness, back nontender, n/v intact, ua 1+ leuks, trace blood  A: uti  P: cipro 250mg  bid x 7d, pyridium, diflucan;  increase water intake, add cranberry juice, return if not improving in 2 -3 days, return earlier if worsening, discussed pyelonephritis sx

## 2016-03-06 DIAGNOSIS — H52223 Regular astigmatism, bilateral: Secondary | ICD-10-CM | POA: Diagnosis not present

## 2016-03-06 DIAGNOSIS — H5213 Myopia, bilateral: Secondary | ICD-10-CM | POA: Diagnosis not present

## 2016-05-28 DIAGNOSIS — L719 Rosacea, unspecified: Secondary | ICD-10-CM | POA: Diagnosis not present

## 2016-05-28 DIAGNOSIS — D229 Melanocytic nevi, unspecified: Secondary | ICD-10-CM | POA: Diagnosis not present

## 2016-05-28 DIAGNOSIS — Z85828 Personal history of other malignant neoplasm of skin: Secondary | ICD-10-CM | POA: Diagnosis not present

## 2016-05-28 DIAGNOSIS — D225 Melanocytic nevi of trunk: Secondary | ICD-10-CM | POA: Diagnosis not present

## 2016-05-28 DIAGNOSIS — D485 Neoplasm of uncertain behavior of skin: Secondary | ICD-10-CM | POA: Diagnosis not present

## 2016-05-28 DIAGNOSIS — Z1283 Encounter for screening for malignant neoplasm of skin: Secondary | ICD-10-CM | POA: Diagnosis not present

## 2016-05-28 DIAGNOSIS — Z85820 Personal history of malignant melanoma of skin: Secondary | ICD-10-CM | POA: Diagnosis not present

## 2017-01-03 ENCOUNTER — Encounter: Payer: Self-pay | Admitting: Family

## 2017-01-03 ENCOUNTER — Ambulatory Visit (INDEPENDENT_AMBULATORY_CARE_PROVIDER_SITE_OTHER): Payer: BC Managed Care – PPO | Admitting: Family

## 2017-01-03 VITALS — BP 106/70 | HR 90 | Temp 98.4°F | Ht 63.25 in | Wt 169.8 lb

## 2017-01-03 DIAGNOSIS — Z0000 Encounter for general adult medical examination without abnormal findings: Secondary | ICD-10-CM | POA: Diagnosis not present

## 2017-01-03 LAB — LIPID PANEL
CHOL/HDL RATIO: 4
CHOLESTEROL: 202 mg/dL — AB (ref 0–200)
HDL: 54.2 mg/dL (ref >39.00)
LDL Cholesterol: 123 mg/dL — ABNORMAL HIGH (ref 0–99)
NonHDL: 148.14
TRIGLYCERIDES: 128 mg/dL (ref 0.0–149.0)
VLDL: 25.6 mg/dL (ref 0.0–40.0)

## 2017-01-03 LAB — HEMOGLOBIN A1C: HEMOGLOBIN A1C: 5.1 % (ref 4.6–6.5)

## 2017-01-03 LAB — COMPREHENSIVE METABOLIC PANEL
ALT: 8 U/L (ref 0–35)
AST: 13 U/L (ref 0–37)
Albumin: 4.3 g/dL (ref 3.5–5.2)
Alkaline Phosphatase: 53 U/L (ref 39–117)
BUN: 14 mg/dL (ref 6–23)
CALCIUM: 9.7 mg/dL (ref 8.4–10.5)
CHLORIDE: 103 meq/L (ref 96–112)
CO2: 29 mEq/L (ref 19–32)
CREATININE: 0.67 mg/dL (ref 0.40–1.20)
GFR: 102.76 mL/min (ref >60.00)
Glucose, Bld: 88 mg/dL (ref 70–99)
POTASSIUM: 3.9 meq/L (ref 3.5–5.1)
Sodium: 138 mEq/L (ref 135–145)
Total Bilirubin: 1.3 mg/dL — ABNORMAL HIGH (ref 0.2–1.2)
Total Protein: 7.5 g/dL (ref 6.0–8.3)

## 2017-01-03 LAB — CBC WITH DIFFERENTIAL/PLATELET
BASOS PCT: 0.5 % (ref 0.0–3.0)
Basophils Absolute: 0 10*3/uL (ref 0.0–0.1)
EOS ABS: 0.1 10*3/uL (ref 0.0–0.7)
EOS PCT: 0.8 % (ref 0.0–5.0)
HEMATOCRIT: 40.1 % (ref 36.0–46.0)
HEMOGLOBIN: 13.7 g/dL (ref 12.0–15.0)
LYMPHS PCT: 22.4 % (ref 12.0–46.0)
Lymphs Abs: 2 10*3/uL (ref 0.7–4.0)
MCHC: 34.1 g/dL (ref 30.0–36.0)
MCV: 96.5 fl (ref 78.0–100.0)
Monocytes Absolute: 0.8 10*3/uL (ref 0.1–1.0)
Monocytes Relative: 8.6 % (ref 3.0–12.0)
NEUTROS ABS: 6.2 10*3/uL (ref 1.4–7.7)
Neutrophils Relative %: 67.7 % (ref 43.0–77.0)
Platelets: 295 10*3/uL (ref 150.0–400.0)
RBC: 4.15 Mil/uL (ref 3.87–5.11)
RDW: 12.1 % (ref 11.5–15.5)
WBC: 9.2 10*3/uL (ref 4.0–10.5)

## 2017-01-03 LAB — TSH: TSH: 1.06 u[IU]/mL (ref 0.35–4.50)

## 2017-01-03 LAB — VITAMIN D 25 HYDROXY (VIT D DEFICIENCY, FRACTURES): VITD: 16.38 ng/mL — AB (ref 30.00–100.00)

## 2017-01-03 MED ORDER — BUPROPION HCL ER (XL) 150 MG PO TB24: 150.0000 mg | ORAL_TABLET | Freq: Every day | ORAL | 1 refills | Status: DC

## 2017-01-03 NOTE — Progress Notes (Signed)
Subjective:    Patient ID: Christine Petty, female    DOB: Oct 10, 1975, 41 y.o.   MRN: [id number]  CC: Christine Petty is a 41 y.o. female who presents today for physical exam.    HPI: Overall feels well today. Notes that she's had difficulty losing weight of late. She's changed jobs. In the past, she is done quite well with exercise, dietary modifications. She plans to start these lifestyle modifications again. No history of seizures, heavy alcohol use    Colorectal Cancer Screening:no early family history Breast Cancer Screening: Mammogram due Cervical Cancer Screening: UTD 2017; normal per patient; unable to see report Bone Health screening/DEXA for 65+: No increased fracture risk. Defer screening at this time. Lung Cancer Screening: Doesn't have 30 year pack year history and age > 60 years.       Tetanus - utd         HIV Screening- Candidate for, consents Labs: Screening labs today. Exercise: Gets regular exercise.  Alcohol use: rare Smoking/tobacco use: Nonsmoker.  Regular dental exams: UTD Wears seat belt: Yes. H/o melanoma; Dr Nehemiah Massed  HISTORY:  Past Medical History:  Diagnosis Date  . Cancer ([place]) 2010   Skin Cancer  . History of frequent urinary tract infections   . Melanoma [facility])     Past Surgical History:  Procedure Laterality Date  . CESAREAN SECTION  2007  . EXCISION MELANOMA WITH SENTINEL LYMPH NODE BIOPSY  2010  . TONSILLECTOMY AND ADENOIDECTOMY  2009   Family History  Problem Relation Age of Onset  . Cancer Mother 66       Breast Cancer  . Hyperlipidemia Mother   . Hypertension Mother   . Breast cancer Mother 28  . Hyperlipidemia Father   . Hypertension Father   . Breast cancer Paternal Aunt 65      ALLERGIES: Patient has no known allergies.  Current Outpatient Medications on File Prior to Visit  Medication Sig Dispense Refill  . ciprofloxacin (CIPRO) 250 MG tablet Take 1 tablet (250 mg total) by mouth 2 (two) times daily. 14 tablet 0  .  phenazopyridine (PYRIDIUM) 200 MG tablet Take 1 tablet (200 mg total) by mouth 3 (three) times daily as needed for pain. 10 tablet 0   No current facility-administered medications on file prior to visit.     Social History   Tobacco Use  . Smoking status: Never Smoker  . Smokeless tobacco: Never Used  Substance Use Topics  . Alcohol use: Yes    Alcohol/week: 0.0 oz    Comment: Rare  . Drug use: No    Review of Systems  Constitutional: Negative for chills, fever and unexpected weight change.  HENT: Negative for congestion.   Respiratory: Negative for cough.   Cardiovascular: Negative for chest pain, palpitations and leg swelling.  Gastrointestinal: Negative for nausea and vomiting.  Musculoskeletal: Negative for arthralgias and myalgias.  Skin: Negative for rash.  Neurological: Negative for headaches.  Hematological: Negative for adenopathy.  Psychiatric/Behavioral: Negative for confusion.      Objective:    BP 106/70   Pulse 90   Temp 98.4 F (36.9 C) (Oral)   Ht 5' 3.25" (1.607 m)   Wt 169 lb 12.8 oz (77 kg)   SpO2 97%   BMI 29.84 kg/m   BP Readings from Last 3 Encounters:  01/03/17 106/70  01/25/16 102/70  12/12/15 130/88   Wt Readings from Last 3 Encounters:  01/03/17 169 lb 12.8 oz (77 kg)  12/12/15 155  lb (70.3 kg)  11/04/15 155 lb (70.3 kg)    Physical Exam  Constitutional: She appears well-developed and well-nourished.  Eyes: Conjunctivae are normal.  Neck: No thyroid mass and no thyromegaly present.  Cardiovascular: Normal rate, regular rhythm, normal heart sounds and normal pulses.  Pulmonary/Chest: Effort normal and breath sounds normal. She has no wheezes. She has no rhonchi. She has no rales. Right breast exhibits no inverted nipple, no mass, no nipple discharge, no skin change and no tenderness. Left breast exhibits no inverted nipple, no mass, no nipple discharge, no skin change and no tenderness. Breasts are symmetrical.  CBE performed.     Lymphadenopathy:       Head (right side): No submental, no submandibular, no tonsillar, no preauricular, no posterior auricular and no occipital adenopathy present.       Head (left side): No submental, no submandibular, no tonsillar, no preauricular, no posterior auricular and no occipital adenopathy present.    She has no cervical adenopathy.       Right cervical: No superficial cervical, no deep cervical and no posterior cervical adenopathy present.      Left cervical: No superficial cervical, no deep cervical and no posterior cervical adenopathy present.    She has no axillary adenopathy.  Neurological: She is alert.  Skin: Skin is warm and dry.  Psychiatric: She has a normal mood and affect. Her speech is normal and behavior is normal. Thought content normal.  Vitals reviewed.      Assessment & Plan:   Problem List Items Addressed This Visit      Other   Routine general medical examination at a health care facility - Primary    Clinical breast exam performed today. Patient declines pelvic exam in the absence of symptoms. Mammogram ordered, patient will schedule 3-D. Discussed with patient frustration regarding weight gain, she will try Wellbutrin after making some lifestyle modifications at home. She will follow-up with [place] in this office. Screening labs today.      Relevant Medications   buPROPion (WELLBUTRIN XL) 150 MG 24 hr tablet   Other Relevant Orders   CBC with Differential/Platelet   Comprehensive metabolic panel   Hemoglobin A1c   HIV antibody   TSH   VITAMIN D 25 Hydroxy (Vit-D Deficiency, Fractures)   Lipid panel   MM SCREENING BREAST TOMO BILATERAL       I am having Christine Petty start on buPROPion. I am also having her maintain her ciprofloxacin and phenazopyridine.   Meds ordered this encounter  Medications  . buPROPion (WELLBUTRIN XL) 150 MG 24 hr tablet    Sig: Take 1 tablet (150 mg total) daily by mouth. Take one tablet by mouth every morning for 7  days, and then increase to two tablets by mouth every morning.    Dispense:  90 tablet    Refill:  1    Order Specific Question:   Supervising Provider    Answer:   Crecencio Mc [[id number]]    Return precautions given.   Risks, benefits, and alternatives of the medications and treatment plan prescribed today were discussed, and patient expressed understanding.   Education regarding symptom management and diagnosis given to patient on AVS.   Continue to follow with Burnard Hawthorne, FNP for routine health maintenance.   Christine Petty and I agreed with plan.   Mable Paris, FNP

## 2017-01-03 NOTE — Assessment & Plan Note (Signed)
Clinical breast exam performed today. Patient declines pelvic exam in the absence of symptoms. Mammogram ordered, patient will schedule 3-D. Discussed with patient frustration regarding weight gain, she will try Wellbutrin after making some lifestyle modifications at home. She will follow-up with [place] in this office. Screening labs today.

## 2017-01-03 NOTE — Patient Instructions (Addendum)
Labs  Trial of wellbutrin if you would like-- remember use as a carrot and start after making positive changes at home  We placed a referral for mammogram this year. I asked that you call one the below locations and schedule this when it is convenient for you.   As discussed, I would like you to ask for 3D mammogram over the traditional 2D mammogram as new evidence suggest 3D is superior.   Please note that NOT all insurance companies cover 3D and you may have to pay a higher copay. You may call your insurance company to further clarify your benefits.   Options for [place]  [place], [place]  * [facility] [place], [place] * Note if you ask for 3D mammogram at this location, you must request [place], [place] location*   Health Maintenance, Female Adopting a healthy lifestyle and getting preventive care can go a long way to promote health and wellness. Talk with your health care provider about what schedule of regular examinations is right for you. This is a good chance for you to check in with your provider about disease prevention and staying healthy. In between checkups, there are plenty of things you can do on your own. Experts have done a lot of research about which lifestyle changes and preventive measures are most likely to keep you healthy. Ask your health care provider for more information. Weight and diet Eat a healthy diet  Be sure to include plenty of vegetables, fruits, low-fat dairy products, and lean protein.  Do not eat a lot of foods high in solid fats, added sugars, or salt.  Get regular exercise. This is one of the most important things you can do for your health. ? Most adults should exercise for at least 150 minutes each week. The exercise should increase your heart rate and make you sweat (moderate-intensity  exercise). ? Most adults should also do strengthening exercises at least twice a week. This is in addition to the moderate-intensity exercise.  Maintain a healthy weight  Body mass index (BMI) is a measurement that can be used to identify possible weight problems. It estimates body fat based on height and weight. Your health care provider can help determine your BMI and help you achieve or maintain a healthy weight.  For females 43 years of age and older: ? A BMI below 18.5 is considered underweight. ? A BMI of 18.5 to 24.9 is normal. ? A BMI of 25 to 29.9 is considered overweight. ? A BMI of 30 and above is considered obese.  Watch levels of cholesterol and blood lipids  You should start having your blood tested for lipids and cholesterol at 41 years of age, then have this test every 5 years.  You may need to have your cholesterol levels checked more often if: ? Your lipid or cholesterol levels are high. ? You are older than 41 years of age. ? You are at high risk for heart disease.  Cancer screening Lung Cancer  Lung cancer screening is recommended for adults 62-58 years old who are at high risk for lung cancer because of a history of smoking.  A yearly low-dose CT scan of the lungs is recommended for people who: ? Currently smoke. ? Have quit within the past 15 years. ? Have at least a 30-pack-year history of smoking. A pack  year is smoking an average of one pack of cigarettes a day for 1 year.  Yearly screening should continue until it has been 15 years since you quit.  Yearly screening should stop if you develop a health problem that would prevent you from having lung cancer treatment.  Breast Cancer  Practice breast self-awareness. This means understanding how your breasts normally appear and feel.  It also means doing regular breast self-exams. Let your health care provider know about any changes, no matter how small.  If you are in your 20s or 30s, you should have a  clinical breast exam (CBE) by a health care provider every 1-3 years as part of a regular health exam.  If you are 30 or older, have a CBE every year. Also consider having a breast X-ray (mammogram) every year.  If you have a family history of breast cancer, talk to your health care provider about genetic screening.  If you are at high risk for breast cancer, talk to your health care provider about having an MRI and a mammogram every year.  Breast cancer gene (BRCA) assessment is recommended for women who have family members with BRCA-related cancers. BRCA-related cancers include: ? Breast. ? Ovarian. ? Tubal. ? Peritoneal cancers.  Results of the assessment will determine the need for genetic counseling and BRCA1 and BRCA2 testing.  Cervical Cancer Your health care provider may recommend that you be screened regularly for cancer of the pelvic organs (ovaries, uterus, and vagina). This screening involves a pelvic examination, including checking for microscopic changes to the surface of your cervix (Pap test). You may be encouraged to have this screening done every 3 years, beginning at age 67.  For women ages 83-65, health care providers may recommend pelvic exams and Pap testing every 3 years, or they may recommend the Pap and pelvic exam, combined with testing for human papilloma virus (HPV), every 5 years. Some types of HPV increase your risk of cervical cancer. Testing for HPV may also be done on women of any age with unclear Pap test results.  Other health care providers may not recommend any screening for nonpregnant women who are considered low risk for pelvic cancer and who do not have symptoms. Ask your health care provider if a screening pelvic exam is right for you.  If you have had past treatment for cervical cancer or a condition that could lead to cancer, you need Pap tests and screening for cancer for at least 20 years after your treatment. If Pap tests have been discontinued,  your risk factors (such as having a new sexual partner) need to be reassessed to determine if screening should resume. Some women have medical problems that increase the chance of getting cervical cancer. In these cases, your health care provider may recommend more frequent screening and Pap tests.  Colorectal Cancer  This type of cancer can be detected and often prevented.  Routine colorectal cancer screening usually begins at 41 years of age and continues through 41 years of age.  Your health care provider may recommend screening at an earlier age if you have risk factors for colon cancer.  Your health care provider may also recommend using home test kits to check for hidden blood in the stool.  A small camera at the end of a tube can be used to examine your colon directly (sigmoidoscopy or colonoscopy). This is done to check for the earliest forms of colorectal cancer.  Routine screening usually begins at age 51.  Direct examination of the colon should be repeated every 5-10 years through 41 years of age. However, you may need to be screened more often if early forms of precancerous polyps or small growths are found.  Skin Cancer  Check your skin from head to toe regularly.  Tell your health care provider about any new moles or changes in moles, especially if there is a change in a mole's shape or color.  Also tell your health care provider if you have a mole that is larger than the size of a pencil eraser.  Always use sunscreen. Apply sunscreen liberally and repeatedly throughout the day.  Protect yourself by wearing long sleeves, pants, a wide-brimmed hat, and sunglasses whenever you are outside.  Heart disease, diabetes, and high blood pressure  High blood pressure causes heart disease and increases the risk of stroke. High blood pressure is more likely to develop in: ? People who have blood pressure in the high end of the normal range (130-139/85-89 mm Hg). ? People who are  overweight or obese. ? People who are African American.  If you are 32-56 years of age, have your blood pressure checked every 3-5 years. If you are 20 years of age or older, have your blood pressure checked every year. You should have your blood pressure measured twice-once when you are at a hospital or clinic, and once when you are not at a hospital or clinic. Record the average of the two measurements. To check your blood pressure when you are not at a hospital or clinic, you can use: ? An automated blood pressure machine at a pharmacy. ? A home blood pressure monitor.  If you are between 4 years and 87 years old, ask your health care provider if you should take aspirin to prevent strokes.  Have regular diabetes screenings. This involves taking a blood sample to check your fasting blood sugar level. ? If you are at a normal weight and have a low risk for diabetes, have this test once every three years after 41 years of age. ? If you are overweight and have a high risk for diabetes, consider being tested at a younger age or more often. Preventing infection Hepatitis B  If you have a higher risk for hepatitis B, you should be screened for this virus. You are considered at high risk for hepatitis B if: ? You were born in a country where hepatitis B is common. Ask your health care provider which countries are considered high risk. ? Your parents were born in a high-risk country, and you have not been immunized against hepatitis B (hepatitis B vaccine). ? You have HIV or AIDS. ? You use needles to inject street drugs. ? You live with someone who has hepatitis B. ? You have had sex with someone who has hepatitis B. ? You get hemodialysis treatment. ? You take certain medicines for conditions, including cancer, organ transplantation, and autoimmune conditions.  Hepatitis C  Blood testing is recommended for: ? Everyone born from [birth year] through 1965. ? Anyone with known risk factors for  hepatitis C.  Sexually transmitted infections (STIs)  You should be screened for sexually transmitted infections (STIs) including gonorrhea and chlamydia if: ? You are sexually active and are younger than 41 years of age. ? You are older than 41 years of age and your health care provider tells you that you are at risk for this type of infection. ? Your sexual activity has changed since you were last screened and  you are at an increased risk for chlamydia or gonorrhea. Ask your health care provider if you are at risk.  If you do not have HIV, but are at risk, it may be recommended that you take a prescription medicine daily to prevent HIV infection. This is called pre-exposure prophylaxis (PrEP). You are considered at risk if: ? You are sexually active and do not regularly use condoms or know the HIV status of your partner(s). ? You take drugs by injection. ? You are sexually active with a partner who has HIV.  Talk with your health care provider about whether you are at high risk of being infected with HIV. If you choose to begin PrEP, you should first be tested for HIV. You should then be tested every 3 months for as long as you are taking PrEP. Pregnancy  If you are premenopausal and you may become pregnant, ask your health care provider about preconception counseling.  If you may become pregnant, take 400 to 800 micrograms (mcg) of folic acid every day.  If you want to prevent pregnancy, talk to your health care provider about birth control (contraception). Osteoporosis and menopause  Osteoporosis is a disease in which the bones lose minerals and strength with aging. This can result in serious bone fractures. Your risk for osteoporosis can be identified using a bone density scan.  If you are 79 years of age or older, or if you are at risk for osteoporosis and fractures, ask your health care provider if you should be screened.  Ask your health care provider whether you should take a  calcium or vitamin D supplement to lower your risk for osteoporosis.  Menopause may have certain physical symptoms and risks.  Hormone replacement therapy may reduce some of these symptoms and risks. Talk to your health care provider about whether hormone replacement therapy is right for you. Follow these instructions at home:  Schedule regular health, dental, and eye exams.  Stay current with your immunizations.  Do not use any tobacco products including cigarettes, chewing tobacco, or electronic cigarettes.  If you are pregnant, do not drink alcohol.  If you are breastfeeding, limit how much and how often you drink alcohol.  Limit alcohol intake to no more than 1 drink per day for nonpregnant women. One drink equals 12 ounces of beer, 5 ounces of wine, or 1 ounces of hard liquor.  Do not use street drugs.  Do not share needles.  Ask your health care provider for help if you need support or information about quitting drugs.  Tell your health care provider if you often feel depressed.  Tell your health care provider if you have ever been abused or do not feel safe at home. This information is not intended to replace advice given to you by your health care provider. Make sure you discuss any questions you have with your health care provider. Document Released: 08/28/2010 Document Revised: 07/21/2015 Document Reviewed: 11/16/2014 Elsevier Interactive Patient Education  [employer].

## 2017-01-04 LAB — HIV ANTIBODY (ROUTINE TESTING W REFLEX): HIV: NONREACTIVE

## 2017-01-30 ENCOUNTER — Ambulatory Visit: Payer: Self-pay

## 2017-02-11 ENCOUNTER — Ambulatory Visit
Admission: RE | Admit: 2017-02-11 | Discharge: 2017-02-11 | Disposition: A | Discharge status: Home / Self Care | Payer: BC Managed Care – PPO | Source: Ambulatory Visit | Attending: Family | Admitting: Family

## 2017-02-11 DIAGNOSIS — Z1231 Encounter for screening mammogram for malignant neoplasm of breast: Secondary | ICD-10-CM | POA: Diagnosis present

## 2017-02-11 DIAGNOSIS — Z0000 Encounter for general adult medical examination without abnormal findings: Secondary | ICD-10-CM

## 2017-02-12 ENCOUNTER — Other Ambulatory Visit: Payer: Self-pay | Admitting: Family Medicine

## 2017-02-12 DIAGNOSIS — N6489 Other specified disorders of breast: Secondary | ICD-10-CM

## 2017-02-12 DIAGNOSIS — R928 Other abnormal and inconclusive findings on diagnostic imaging of breast: Secondary | ICD-10-CM

## 2017-02-14 ENCOUNTER — Other Ambulatory Visit: Payer: Self-pay | Admitting: Internal Medicine

## 2017-02-14 DIAGNOSIS — R928 Other abnormal and inconclusive findings on diagnostic imaging of breast: Secondary | ICD-10-CM

## 2017-02-14 NOTE — Progress Notes (Signed)
Order placed for f/u right breast mammogram and ultrasound  

## 2017-03-04 ENCOUNTER — Ambulatory Visit
Admission: RE | Admit: 2017-03-04 | Discharge: 2017-03-04 | Disposition: A | Discharge status: Home / Self Care | Payer: BC Managed Care – PPO | Source: Ambulatory Visit | Attending: Family Medicine | Admitting: Family Medicine

## 2017-03-04 DIAGNOSIS — N6489 Other specified disorders of breast: Secondary | ICD-10-CM | POA: Diagnosis present

## 2017-03-04 DIAGNOSIS — R928 Other abnormal and inconclusive findings on diagnostic imaging of breast: Secondary | ICD-10-CM

## 2018-01-27 ENCOUNTER — Encounter: Payer: Self-pay | Admitting: Family

## 2018-01-27 ENCOUNTER — Ambulatory Visit (INDEPENDENT_AMBULATORY_CARE_PROVIDER_SITE_OTHER): Payer: 59 | Admitting: Family

## 2018-01-27 VITALS — BP 118/62 | HR 70 | Temp 97.9°F | Wt 158.8 lb

## 2018-01-27 DIAGNOSIS — Z0000 Encounter for general adult medical examination without abnormal findings: Secondary | ICD-10-CM | POA: Diagnosis not present

## 2018-01-27 MED ORDER — BUPROPION HCL ER (XL) 150 MG PO TB24: ORAL_TABLET | ORAL | 1 refills | Status: DC

## 2018-01-27 NOTE — Progress Notes (Signed)
Subjective:    Patient ID: Christine Petty, female    DOB: 01-09-1976, 42 y.o.   MRN: [id number]  CC: VANI GUNNER is a 42 y.o. female who presents today for physical exam.    HPI: Feels well today. No new complaints.   Has lost weight intentionally with exercise, intermittent fasting.  Never started the Wellbutrin which was prescribed to her last year.  She feels like now she is on the right track and would like to try this medication for additional weight loss.   No depression, anxiety. No si/hi  No h/o seizure or heavy alchol use.     Colorectal Cancer Screening: No early family history Breast Cancer Screening: Mammogram UTD Cervical Cancer Screening: UTD        Tetanus - utd       Labs: Screening labs today. Exercise: Gets regular exercise.  Alcohol use: rare Smoking/tobacco use: Nonsmoker.  Wears seat belt: Yes.  HISTORY:  Past Medical History:  Diagnosis Date  . Cancer ([place]) 2010   Skin Cancer  . History of frequent urinary tract infections   . Melanoma [facility])     Past Surgical History:  Procedure Laterality Date  . CESAREAN SECTION  2007  . EXCISION MELANOMA WITH SENTINEL LYMPH NODE BIOPSY  2010  . TONSILLECTOMY AND ADENOIDECTOMY  2009   Family History  Problem Relation Age of Onset  . Cancer Mother 34       Breast Cancer  . Hyperlipidemia Mother   . Hypertension Mother   . Breast cancer Mother 38  . Hyperlipidemia Father   . Hypertension Father   . Breast cancer Paternal Aunt 71      ALLERGIES: Patient has no known allergies.  Current Outpatient Medications on File Prior to Visit  Medication Sig Dispense Refill  . buPROPion (WELLBUTRIN XL) 150 MG 24 hr tablet Take 1 tablet (150 mg total) daily by mouth. Take one tablet by mouth every morning for 7 days, and then increase to two tablets by mouth every morning. 90 tablet 1  . phenazopyridine (PYRIDIUM) 200 MG tablet Take 1 tablet (200 mg total) by mouth 3 (three) times daily as needed for pain.  10 tablet 0   No current facility-administered medications on file prior to visit.     Social History   Tobacco Use  . Smoking status: Never Smoker  . Smokeless tobacco: Never Used  Substance Use Topics  . Alcohol use: Yes    Alcohol/week: 0.0 standard drinks    Comment: Rare  . Drug use: No    Review of Systems  Constitutional: Negative for chills, fever and unexpected weight change.  HENT: Negative for congestion.   Respiratory: Negative for cough.   Cardiovascular: Negative for chest pain, palpitations and leg swelling.  Gastrointestinal: Negative for nausea and vomiting.  Genitourinary: Negative for dysuria and pelvic pain.  Musculoskeletal: Negative for arthralgias and myalgias.  Skin: Negative for rash.  Neurological: Negative for seizures and headaches.  Hematological: Negative for adenopathy.  Psychiatric/Behavioral: Negative for confusion and suicidal ideas.      Objective:    BP 118/62 (BP Location: Left Arm, Patient Position: Sitting, Cuff Size: Normal)   Pulse 70   Temp 97.9 F (36.6 C)   Wt 158 lb 12.8 oz (72 kg)   SpO2 97%   BMI 27.91 kg/m   BP Readings from Last 3 Encounters:  01/27/18 118/62  01/03/17 106/70  01/25/16 102/70   Wt Readings from Last 3 Encounters:  01/27/18  158 lb 12.8 oz (72 kg)  01/03/17 169 lb 12.8 oz (77 kg)  12/12/15 155 lb (70.3 kg)    Physical Exam  Constitutional: She appears well-developed and well-nourished.  Eyes: Conjunctivae are normal.  Neck: No thyroid mass and no thyromegaly present.  Cardiovascular: Normal rate, regular rhythm, normal heart sounds and normal pulses.  Pulmonary/Chest: Effort normal and breath sounds normal. She has no wheezes. She has no rhonchi. She has no rales. Right breast exhibits no inverted nipple, no mass, no nipple discharge, no skin change and no tenderness. Left breast exhibits no inverted nipple, no mass, no nipple discharge, no skin change and no tenderness. Breasts are symmetrical.    CBE performed.   Lymphadenopathy:       Head (right side): No submental, no submandibular, no tonsillar, no preauricular, no posterior auricular and no occipital adenopathy present.       Head (left side): No submental, no submandibular, no tonsillar, no preauricular, no posterior auricular and no occipital adenopathy present.    She has no cervical adenopathy.       Right cervical: No superficial cervical, no deep cervical and no posterior cervical adenopathy present.      Left cervical: No superficial cervical, no deep cervical and no posterior cervical adenopathy present.    She has no axillary adenopathy.  Neurological: She is alert.  Skin: Skin is warm and dry.  Psychiatric: She has a normal mood and affect. Her speech is normal and behavior is normal. Thought content normal.  Vitals reviewed.      Assessment & Plan:   Problem List Items Addressed This Visit    None       I have discontinued Stela Iwasaki. Gero's ciprofloxacin. I am also having her maintain her phenazopyridine and buPROPion.   No orders of the defined types were placed in this encounter.   Return precautions given.   Risks, benefits, and alternatives of the medications and treatment plan prescribed today were discussed, and patient expressed understanding.   Education regarding symptom management and diagnosis given to patient on AVS.   Continue to follow with Burnard Hawthorne, FNP for routine health maintenance.   Christine Petty and I agreed with plan.   Mable Paris, FNP

## 2018-01-27 NOTE — Patient Instructions (Signed)
Pleasure seeing you  Let me know how you do on the wellbutrin  Health Maintenance, Female Adopting a healthy lifestyle and getting preventive care can go a long way to promote health and wellness. Talk with your health care provider about what schedule of regular examinations is right for you. This is a good chance for you to check in with your provider about disease prevention and staying healthy. In between checkups, there are plenty of things you can do on your own. Experts have done a lot of research about which lifestyle changes and preventive measures are most likely to keep you healthy. Ask your health care provider for more information. Weight and diet Eat a healthy diet  Be sure to include plenty of vegetables, fruits, low-fat dairy products, and lean protein.  Do not eat a lot of foods high in solid fats, added sugars, or salt.  Get regular exercise. This is one of the most important things you can do for your health. ? Most adults should exercise for at least 150 minutes each week. The exercise should increase your heart rate and make you sweat (moderate-intensity exercise). ? Most adults should also do strengthening exercises at least twice a week. This is in addition to the moderate-intensity exercise.  Maintain a healthy weight  Body mass index (BMI) is a measurement that can be used to identify possible weight problems. It estimates body fat based on height and weight. Your health care provider can help determine your BMI and help you achieve or maintain a healthy weight.  For females 27 years of age and older: ? A BMI below 18.5 is considered underweight. ? A BMI of 18.5 to 24.9 is normal. ? A BMI of 25 to 29.9 is considered overweight. ? A BMI of 30 and above is considered obese.  Watch levels of cholesterol and blood lipids  You should start having your blood tested for lipids and cholesterol at 42 years of age, then have this test every 5 years.  You may need to  have your cholesterol levels checked more often if: ? Your lipid or cholesterol levels are high. ? You are older than 42 years of age. ? You are at high risk for heart disease.  Cancer screening Lung Cancer  Lung cancer screening is recommended for adults 102-34 years old who are at high risk for lung cancer because of a history of smoking.  A yearly low-dose CT scan of the lungs is recommended for people who: ? Currently smoke. ? Have quit within the past 15 years. ? Have at least a 30-pack-year history of smoking. A pack year is smoking an average of one pack of cigarettes a day for 1 year.  Yearly screening should continue until it has been 15 years since you quit.  Yearly screening should stop if you develop a health problem that would prevent you from having lung cancer treatment.  Breast Cancer  Practice breast self-awareness. This means understanding how your breasts normally appear and feel.  It also means doing regular breast self-exams. Let your health care provider know about any changes, no matter how small.  If you are in your 20s or 30s, you should have a clinical breast exam (CBE) by a health care provider every 1-3 years as part of a regular health exam.  If you are 21 or older, have a CBE every year. Also consider having a breast X-ray (mammogram) every year.  If you have a family history of breast cancer, talk to  your health care provider about genetic screening.  If you are at high risk for breast cancer, talk to your health care provider about having an MRI and a mammogram every year.  Breast cancer gene (BRCA) assessment is recommended for women who have family members with BRCA-related cancers. BRCA-related cancers include: ? Breast. ? Ovarian. ? Tubal. ? Peritoneal cancers.  Results of the assessment will determine the need for genetic counseling and BRCA1 and BRCA2 testing.  Cervical Cancer Your health care provider may recommend that you be screened  regularly for cancer of the pelvic organs (ovaries, uterus, and vagina). This screening involves a pelvic examination, including checking for microscopic changes to the surface of your cervix (Pap test). You may be encouraged to have this screening done every 3 years, beginning at age 80.  For women ages 9-65, health care providers may recommend pelvic exams and Pap testing every 3 years, or they may recommend the Pap and pelvic exam, combined with testing for human papilloma virus (HPV), every 5 years. Some types of HPV increase your risk of cervical cancer. Testing for HPV may also be done on women of any age with unclear Pap test results.  Other health care providers may not recommend any screening for nonpregnant women who are considered low risk for pelvic cancer and who do not have symptoms. Ask your health care provider if a screening pelvic exam is right for you.  If you have had past treatment for cervical cancer or a condition that could lead to cancer, you need Pap tests and screening for cancer for at least 20 years after your treatment. If Pap tests have been discontinued, your risk factors (such as having a new sexual partner) need to be reassessed to determine if screening should resume. Some women have medical problems that increase the chance of getting cervical cancer. In these cases, your health care provider may recommend more frequent screening and Pap tests.  Colorectal Cancer  This type of cancer can be detected and often prevented.  Routine colorectal cancer screening usually begins at 42 years of age and continues through 42 years of age.  Your health care provider may recommend screening at an earlier age if you have risk factors for colon cancer.  Your health care provider may also recommend using home test kits to check for hidden blood in the stool.  A small camera at the end of a tube can be used to examine your colon directly (sigmoidoscopy or colonoscopy). This is  done to check for the earliest forms of colorectal cancer.  Routine screening usually begins at age 45.  Direct examination of the colon should be repeated every 5-10 years through 42 years of age. However, you may need to be screened more often if early forms of precancerous polyps or small growths are found.  Skin Cancer  Check your skin from head to toe regularly.  Tell your health care provider about any new moles or changes in moles, especially if there is a change in a mole's shape or color.  Also tell your health care provider if you have a mole that is larger than the size of a pencil eraser.  Always use sunscreen. Apply sunscreen liberally and repeatedly throughout the day.  Protect yourself by wearing long sleeves, pants, a wide-brimmed hat, and sunglasses whenever you are outside.  Heart disease, diabetes, and high blood pressure  High blood pressure causes heart disease and increases the risk of stroke. High blood pressure is more likely  to develop in: ? People who have blood pressure in the high end of the normal range (130-139/85-89 mm Hg). ? People who are overweight or obese. ? People who are African American.  If you are 41-24 years of age, have your blood pressure checked every 3-5 years. If you are 31 years of age or older, have your blood pressure checked every year. You should have your blood pressure measured twice-once when you are at a hospital or clinic, and once when you are not at a hospital or clinic. Record the average of the two measurements. To check your blood pressure when you are not at a hospital or clinic, you can use: ? An automated blood pressure machine at a pharmacy. ? A home blood pressure monitor.  If you are between 45 years and 72 years old, ask your health care provider if you should take aspirin to prevent strokes.  Have regular diabetes screenings. This involves taking a blood sample to check your fasting blood sugar level. ? If you are  at a normal weight and have a low risk for diabetes, have this test once every three years after 42 years of age. ? If you are overweight and have a high risk for diabetes, consider being tested at a younger age or more often. Preventing infection Hepatitis B  If you have a higher risk for hepatitis B, you should be screened for this virus. You are considered at high risk for hepatitis B if: ? You were born in a country where hepatitis B is common. Ask your health care provider which countries are considered high risk. ? Your parents were born in a high-risk country, and you have not been immunized against hepatitis B (hepatitis B vaccine). ? You have HIV or AIDS. ? You use needles to inject street drugs. ? You live with someone who has hepatitis B. ? You have had sex with someone who has hepatitis B. ? You get hemodialysis treatment. ? You take certain medicines for conditions, including cancer, organ transplantation, and autoimmune conditions.  Hepatitis C  Blood testing is recommended for: ? Everyone born from [birth year] through 1965. ? Anyone with known risk factors for hepatitis C.  Sexually transmitted infections (STIs)  You should be screened for sexually transmitted infections (STIs) including gonorrhea and chlamydia if: ? You are sexually active and are younger than 42 years of age. ? You are older than 42 years of age and your health care provider tells you that you are at risk for this type of infection. ? Your sexual activity has changed since you were last screened and you are at an increased risk for chlamydia or gonorrhea. Ask your health care provider if you are at risk.  If you do not have HIV, but are at risk, it may be recommended that you take a prescription medicine daily to prevent HIV infection. This is called pre-exposure prophylaxis (PrEP). You are considered at risk if: ? You are sexually active and do not regularly use condoms or know the HIV status of your  partner(s). ? You take drugs by injection. ? You are sexually active with a partner who has HIV.  Talk with your health care provider about whether you are at high risk of being infected with HIV. If you choose to begin PrEP, you should first be tested for HIV. You should then be tested every 3 months for as long as you are taking PrEP. Pregnancy  If you are premenopausal and you may become  pregnant, ask your health care provider about preconception counseling.  If you may become pregnant, take 400 to 800 micrograms (mcg) of folic acid every day.  If you want to prevent pregnancy, talk to your health care provider about birth control (contraception). Osteoporosis and menopause  Osteoporosis is a disease in which the bones lose minerals and strength with aging. This can result in serious bone fractures. Your risk for osteoporosis can be identified using a bone density scan.  If you are 72 years of age or older, or if you are at risk for osteoporosis and fractures, ask your health care provider if you should be screened.  Ask your health care provider whether you should take a calcium or vitamin D supplement to lower your risk for osteoporosis.  Menopause may have certain physical symptoms and risks.  Hormone replacement therapy may reduce some of these symptoms and risks. Talk to your health care provider about whether hormone replacement therapy is right for you. Follow these instructions at home:  Schedule regular health, dental, and eye exams.  Stay current with your immunizations.  Do not use any tobacco products including cigarettes, chewing tobacco, or electronic cigarettes.  If you are pregnant, do not drink alcohol.  If you are breastfeeding, limit how much and how often you drink alcohol.  Limit alcohol intake to no more than 1 drink per day for nonpregnant women. One drink equals 12 ounces of beer, 5 ounces of wine, or 1 ounces of hard liquor.  Do not use street  drugs.  Do not share needles.  Ask your health care provider for help if you need support or information about quitting drugs.  Tell your health care provider if you often feel depressed.  Tell your health care provider if you have ever been abused or do not feel safe at home. This information is not intended to replace advice given to you by your health care provider. Make sure you discuss any questions you have with your health care provider. Document Released: 08/28/2010 Document Revised: 07/21/2015 Document Reviewed: 11/16/2014 Elsevier Interactive Patient Education  [employer].

## 2018-01-28 ENCOUNTER — Encounter: Payer: Self-pay | Admitting: Family

## 2018-01-28 LAB — CBC WITH DIFFERENTIAL/PLATELET
BASOS PCT: 1.3 % (ref 0.0–3.0)
Basophils Absolute: 0.1 10*3/uL (ref 0.0–0.1)
EOS PCT: 0.6 % (ref 0.0–5.0)
Eosinophils Absolute: 0.1 10*3/uL (ref 0.0–0.7)
HEMATOCRIT: 38.2 % (ref 36.0–46.0)
Hemoglobin: 12.9 g/dL (ref 12.0–15.0)
LYMPHS PCT: 24.2 % (ref 12.0–46.0)
Lymphs Abs: 2.5 10*3/uL (ref 0.7–4.0)
MCHC: 33.8 g/dL (ref 30.0–36.0)
MCV: 95.9 fl (ref 78.0–100.0)
MONOS PCT: 6.2 % (ref 3.0–12.0)
Monocytes Absolute: 0.6 10*3/uL (ref 0.1–1.0)
Neutro Abs: 6.9 10*3/uL (ref 1.4–7.7)
Neutrophils Relative %: 67.7 % (ref 43.0–77.0)
Platelets: 302 10*3/uL (ref 150.0–400.0)
RBC: 3.98 Mil/uL (ref 3.87–5.11)
RDW: 12.5 % (ref 11.5–15.5)
WBC: 10.1 10*3/uL (ref 4.0–10.5)

## 2018-01-28 LAB — COMPREHENSIVE METABOLIC PANEL
ALK PHOS: 42 U/L (ref 39–117)
ALT: 11 U/L (ref 0–35)
AST: 14 U/L (ref 0–37)
Albumin: 4.4 g/dL (ref 3.5–5.2)
BILIRUBIN TOTAL: 1.3 mg/dL — AB (ref 0.2–1.2)
BUN: 17 mg/dL (ref 6–23)
CALCIUM: 9.3 mg/dL (ref 8.4–10.5)
CO2: 26 meq/L (ref 19–32)
Chloride: 104 mEq/L (ref 96–112)
Creatinine, Ser: 0.9 mg/dL (ref 0.40–1.20)
GFR: 72.73 mL/min (ref >60.00)
GLUCOSE: 83 mg/dL (ref 70–99)
POTASSIUM: 4 meq/L (ref 3.5–5.1)
Sodium: 137 mEq/L (ref 135–145)
Total Protein: 7.3 g/dL (ref 6.0–8.3)

## 2018-01-28 LAB — LIPID PANEL
CHOL/HDL RATIO: 4
CHOLESTEROL: 209 mg/dL — AB (ref 0–200)
HDL: 54.7 mg/dL (ref >39.00)
LDL Cholesterol: 138 mg/dL — ABNORMAL HIGH (ref 0–99)
NonHDL: 154.63
Triglycerides: 85 mg/dL (ref 0.0–149.0)
VLDL: 17 mg/dL (ref 0.0–40.0)

## 2018-01-28 LAB — HEMOGLOBIN A1C: Hgb A1c MFr Bld: 5.1 % (ref 4.6–6.5)

## 2018-01-28 LAB — VITAMIN D 25 HYDROXY (VIT D DEFICIENCY, FRACTURES): VITD: 15.44 ng/mL — ABNORMAL LOW (ref 30.00–100.00)

## 2018-01-28 LAB — TSH: TSH: 2.17 u[IU]/mL (ref 0.35–4.50)

## 2018-01-29 ENCOUNTER — Other Ambulatory Visit: Payer: Self-pay | Admitting: Family

## 2018-01-29 DIAGNOSIS — R17 Unspecified jaundice: Secondary | ICD-10-CM

## 2018-01-30 ENCOUNTER — Other Ambulatory Visit (INDEPENDENT_AMBULATORY_CARE_PROVIDER_SITE_OTHER): Payer: 59

## 2018-01-30 DIAGNOSIS — R17 Unspecified jaundice: Secondary | ICD-10-CM | POA: Diagnosis not present

## 2018-01-30 LAB — HEPATIC FUNCTION PANEL
ALT: 12 U/L (ref 0–35)
AST: 15 U/L (ref 0–37)
Albumin: 4.5 g/dL (ref 3.5–5.2)
Alkaline Phosphatase: 41 U/L (ref 39–117)
Bilirubin, Direct: 0.1 mg/dL (ref 0.0–0.3)
TOTAL PROTEIN: 7.4 g/dL (ref 6.0–8.3)
Total Bilirubin: 1.3 mg/dL — ABNORMAL HIGH (ref 0.2–1.2)

## 2018-02-02 ENCOUNTER — Encounter: Payer: Self-pay | Admitting: Family

## 2018-02-03 ENCOUNTER — Encounter: Payer: Self-pay | Admitting: Family

## 2018-02-21 ENCOUNTER — Ambulatory Visit
Admission: RE | Admit: 2018-02-21 | Discharge: 2018-02-21 | Disposition: A | Discharge status: Home / Self Care | Payer: 59 | Source: Ambulatory Visit | Attending: Family | Admitting: Family

## 2018-02-21 DIAGNOSIS — Z0000 Encounter for general adult medical examination without abnormal findings: Secondary | ICD-10-CM | POA: Diagnosis not present

## 2018-02-21 DIAGNOSIS — Z1231 Encounter for screening mammogram for malignant neoplasm of breast: Secondary | ICD-10-CM | POA: Diagnosis not present

## 2018-03-24 DIAGNOSIS — L821 Other seborrheic keratosis: Secondary | ICD-10-CM | POA: Diagnosis not present

## 2018-03-24 DIAGNOSIS — Z1283 Encounter for screening for malignant neoplasm of skin: Secondary | ICD-10-CM | POA: Diagnosis not present

## 2018-03-24 DIAGNOSIS — D224 Melanocytic nevi of scalp and neck: Secondary | ICD-10-CM | POA: Diagnosis not present

## 2018-03-24 DIAGNOSIS — D225 Melanocytic nevi of trunk: Secondary | ICD-10-CM | POA: Diagnosis not present

## 2018-03-24 DIAGNOSIS — D2239 Melanocytic nevi of other parts of face: Secondary | ICD-10-CM | POA: Diagnosis not present

## 2018-03-24 DIAGNOSIS — L578 Other skin changes due to chronic exposure to nonionizing radiation: Secondary | ICD-10-CM | POA: Diagnosis not present

## 2018-03-24 DIAGNOSIS — D485 Neoplasm of uncertain behavior of skin: Secondary | ICD-10-CM | POA: Diagnosis not present

## 2018-03-24 DIAGNOSIS — Z85820 Personal history of malignant melanoma of skin: Secondary | ICD-10-CM | POA: Diagnosis not present

## 2018-03-24 DIAGNOSIS — Z85828 Personal history of other malignant neoplasm of skin: Secondary | ICD-10-CM | POA: Diagnosis not present

## 2018-03-24 DIAGNOSIS — L814 Other melanin hyperpigmentation: Secondary | ICD-10-CM | POA: Diagnosis not present

## 2018-03-24 DIAGNOSIS — D229 Melanocytic nevi, unspecified: Secondary | ICD-10-CM | POA: Diagnosis not present

## 2018-04-21 DIAGNOSIS — H52223 Regular astigmatism, bilateral: Secondary | ICD-10-CM | POA: Diagnosis not present

## 2018-04-21 DIAGNOSIS — H47093 Other disorders of optic nerve, not elsewhere classified, bilateral: Secondary | ICD-10-CM | POA: Diagnosis not present

## 2018-04-21 DIAGNOSIS — H5213 Myopia, bilateral: Secondary | ICD-10-CM | POA: Diagnosis not present

## 2018-04-25 ENCOUNTER — Ambulatory Visit (INDEPENDENT_AMBULATORY_CARE_PROVIDER_SITE_OTHER): Payer: Self-pay | Admitting: Plastic Surgery

## 2018-04-25 ENCOUNTER — Encounter: Payer: Self-pay | Admitting: Plastic Surgery

## 2018-04-25 VITALS — BP 121/75 | HR 79 | Temp 98.1°F | Ht 63.0 in | Wt 147.6 lb

## 2018-04-25 DIAGNOSIS — Z719 Counseling, unspecified: Secondary | ICD-10-CM

## 2018-04-25 DIAGNOSIS — Z7689 Persons encountering health services in other specified circumstances: Secondary | ICD-10-CM

## 2018-04-26 ENCOUNTER — Encounter: Payer: Self-pay | Admitting: Plastic Surgery

## 2018-04-26 NOTE — Progress Notes (Signed)
Preoperative Dx: hyperpigmentation  Postoperative Dx:  same  Procedure: laser to face  Description of Procedure:  Risks and complications were explained to the patient. Consent was confirmed and signed. Time out was called and all information was confirmed to be correct. The area  area was prepped with alcohol and wiped dry. The PR530 laser was set at 7.4 J/cm2. The face was lasered. The patient tolerated the procedure well and there were no complications. The patient is to follow up in 4 weeks.

## 2018-06-03 ENCOUNTER — Ambulatory Visit: Payer: Self-pay | Admitting: Plastic Surgery

## 2018-06-23 ENCOUNTER — Telehealth: Payer: Self-pay | Admitting: Plastic Surgery

## 2018-06-23 NOTE — Telephone Encounter (Signed)
Called patient to confirm appointment scheduled for tomorrow. Patient answered the following questions: °1.Has the patient traveled outside of the state of [place] at all within the past 6 weeks? No °2.Does the patient have a fever or cough at all? No °3.Has the patient been tested for COVID? Had a positive COVID test? No °4. Has the patient been in contact with anyone who has tested positive? No ° °

## 2018-06-24 ENCOUNTER — Encounter: Payer: Self-pay | Admitting: Plastic Surgery

## 2018-06-24 ENCOUNTER — Other Ambulatory Visit: Payer: Self-pay

## 2018-06-24 ENCOUNTER — Ambulatory Visit (INDEPENDENT_AMBULATORY_CARE_PROVIDER_SITE_OTHER): Payer: Self-pay | Admitting: Plastic Surgery

## 2018-06-24 VITALS — BP 107/66 | HR 79 | Temp 98.1°F

## 2018-06-24 DIAGNOSIS — Z719 Counseling, unspecified: Secondary | ICD-10-CM

## 2018-06-24 NOTE — Progress Notes (Signed)
Preoperative Dx: hyperpigmentation  Postoperative Dx:  same  Procedure: laser to face   Anesthesia: none  Description of Procedure:  Risks and complications were explained to the patient. Consent was confirmed and signed. Time out was called and all information was confirmed to be correct. The area  area was prepped with alcohol and wiped dry. The IPL laser was set at 7.6 J/cm2. The face was lasered. The patient tolerated the procedure well and there were no complications. The patient is to follow up in 4 weeks.

## 2018-06-24 NOTE — Progress Notes (Signed)
Botulinum Toxin Procedure Note  Procedure: Cosmetic botulinum toxin  Pre-operative Diagnosis: Dynamic rhytides   Post-operative Diagnosis: Same  Complications:  None  Brief history: The patient desires botulinum toxin injection of her forehead. I discussed with the patient this proposed procedure of botulinum toxin injections, which is customized depending on the particular needs of the patient. It is performed on facial rhytids as a temporary correction. The alternatives were discussed with the patient. The risks were addressed including bleeding, scarring, infection, damage to deeper structures, asymmetry, and chronic pain, which may occur infrequently after a procedure. The individual's choice to undergo a surgical procedure is based on the comparison of risks to potential benefits. Other risks include unsatisfactory results, brow ptosis, eyelid ptosis, allergic reaction, temporary paralysis, which should go away with time, bruising, blurring disturbances and delayed healing. Botulinum toxin injections do not arrest the aging process or produce permanent tightening of the eyelid.  Operative intervention maybe necessary to maintain the results of a blepharoplasty or botulinum toxin. The patient understands and wishes to proceed. An informed consent was signed and informational brochures given to her prior to the procedure.  Procedure: The area was prepped with alcohol and dried with a clean gauze. Using a clean technique, the botulinum toxin was diluted with 1.25 cc of preservative-free normal saline which was slowly injected with an 18 gauge needle in a tuberculin syringes.  A 32 gauge needles were then used to inject the botulinum toxin. This mixture allow for an aliquot of 5 units per 0.1 cc in each injection site.    Subsequently the mixture was injected in the glabellar and forehead area with preservation of the temporal branch to the lateral eyebrow as well as into each lateral canthal area  beginning from the lateral orbital rim medial to the zygomaticus major in 3 separate areas. A total of 30 Units of botulinum toxin was used. The forehead and glabellar area was injected with care to inject intramuscular only while holding pressure on the supratrochlear vessels in each area during each injection on either side of the medial corrugators. The injection proceeded vertically superiorly to the medial 2/3 of the frontalis muscle and superior 2/3 of the lateral frontalis, again with preservation of the frontal branch.  No complications were noted. Light pressure was held for 5 minutes. She was instructed explicitly in post-operative care.  Botox LOT:  [id number] C2 EXP:  6/22

## 2018-08-05 ENCOUNTER — Encounter: Payer: Self-pay | Admitting: Plastic Surgery

## 2018-08-05 ENCOUNTER — Other Ambulatory Visit: Payer: Self-pay

## 2018-08-05 ENCOUNTER — Ambulatory Visit (INDEPENDENT_AMBULATORY_CARE_PROVIDER_SITE_OTHER): Payer: Self-pay | Admitting: Plastic Surgery

## 2018-08-05 VITALS — BP 117/74 | HR 78 | Temp 98.1°F | Ht 63.0 in | Wt 158.0 lb

## 2018-08-05 DIAGNOSIS — Z719 Counseling, unspecified: Secondary | ICD-10-CM

## 2018-08-05 NOTE — Progress Notes (Signed)
Preoperative Dx: hyperpigmentation  Postoperative Dx:  same  Procedure: laser to face   Anesthesia: none  Description of Procedure:  Risks and complications were explained to the patient. Consent was confirmed and signed. Time out was called and all information was confirmed to be correct. The area  area was prepped with alcohol and wiped dry. The IPL laser was set at 7.6 J/cm2. The face was lasered. The patient tolerated the procedure well and there were no complications. The patient is to follow up in 4 weeks.

## 2018-09-08 DIAGNOSIS — Z85828 Personal history of other malignant neoplasm of skin: Secondary | ICD-10-CM | POA: Diagnosis not present

## 2018-09-08 DIAGNOSIS — D485 Neoplasm of uncertain behavior of skin: Secondary | ICD-10-CM | POA: Diagnosis not present

## 2018-09-08 DIAGNOSIS — I789 Disease of capillaries, unspecified: Secondary | ICD-10-CM | POA: Diagnosis not present

## 2018-09-08 DIAGNOSIS — Z85820 Personal history of malignant melanoma of skin: Secondary | ICD-10-CM | POA: Diagnosis not present

## 2018-09-08 DIAGNOSIS — L812 Freckles: Secondary | ICD-10-CM | POA: Diagnosis not present

## 2018-09-08 DIAGNOSIS — Z1283 Encounter for screening for malignant neoplasm of skin: Secondary | ICD-10-CM | POA: Diagnosis not present

## 2018-09-08 DIAGNOSIS — D225 Melanocytic nevi of trunk: Secondary | ICD-10-CM | POA: Diagnosis not present

## 2018-10-13 ENCOUNTER — Telehealth: Payer: Self-pay

## 2018-10-13 NOTE — Telephone Encounter (Signed)

## 2018-10-14 ENCOUNTER — Encounter: Payer: Self-pay | Admitting: Plastic Surgery

## 2018-10-14 ENCOUNTER — Ambulatory Visit (INDEPENDENT_AMBULATORY_CARE_PROVIDER_SITE_OTHER): Payer: Self-pay | Admitting: Plastic Surgery

## 2018-10-14 ENCOUNTER — Other Ambulatory Visit: Payer: Self-pay

## 2018-10-14 DIAGNOSIS — Z719 Counseling, unspecified: Secondary | ICD-10-CM

## 2018-10-14 NOTE — Progress Notes (Signed)
Botulinum Toxin Procedure Note  Procedure: Cosmetic botulinum toxin  Pre-operative Diagnosis: Dynamic rhytides  Post-operative Diagnosis: Same  Complications:  None  Brief history: The patient desires botulinum toxin injection of her forehead. I discussed with the patient this proposed procedure of botulinum toxin injections, which is customized depending on the particular needs of the patient. It is performed on facial rhytids as a temporary correction. The alternatives were discussed with the patient. The risks were addressed including bleeding, scarring, infection, damage to deeper structures, asymmetry, and chronic pain, which may occur infrequently after a procedure. The individual's choice to undergo a surgical procedure is based on the comparison of risks to potential benefits. Other risks include unsatisfactory results, brow ptosis, eyelid ptosis, allergic reaction, temporary paralysis, which should go away with time, bruising, blurring disturbances and delayed healing. Botulinum toxin injections do not arrest the aging process or produce permanent tightening of the eyelid.  Operative intervention maybe necessary to maintain the results of a blepharoplasty or botulinum toxin. The patient understands and wishes to proceed. An informed consent was signed and informational brochures given to her prior to the procedure.  Procedure: The area was prepped with alcohol and dried with a clean gauze. Using a clean technique, the botulinum toxin was diluted with 1.25 cc of preservative-free normal saline which was slowly injected with an 18 gauge needle in a tuberculin syringes.  A 32 gauge needles were then used to inject the botulinum toxin. This mixture allow for an aliquot of 5 units per 0.1 cc in each injection site.    Subsequently the mixture was injected in the glabellar and forehead area with preservation of the temporal branch to the lateral eyebrow as well as into each lateral canthal area  beginning from the lateral orbital rim medial to the zygomaticus major in 3 separate areas. A total of 30 Units of botulinum toxin was used. The forehead and glabellar area was injected with care to inject intramuscular only while holding pressure on the supratrochlear vessels in each area during each injection on either side of the medial corrugators. The injection proceeded vertically superiorly to the medial 2/3 of the frontalis muscle and superior 2/3 of the lateral frontalis, again with preservation of the frontal branch. No complications were noted. Light pressure was held for 5 minutes. She was instructed explicitly in post-operative care.  Botox LOT:  [id number] C2 EXP:  1/23

## 2019-01-19 ENCOUNTER — Telehealth: Payer: Self-pay

## 2019-01-19 NOTE — Telephone Encounter (Signed)

## 2019-01-20 ENCOUNTER — Encounter: Payer: Self-pay | Admitting: Plastic Surgery

## 2019-01-20 ENCOUNTER — Ambulatory Visit (INDEPENDENT_AMBULATORY_CARE_PROVIDER_SITE_OTHER): Payer: Self-pay | Admitting: Plastic Surgery

## 2019-01-20 ENCOUNTER — Other Ambulatory Visit: Payer: Self-pay

## 2019-01-20 VITALS — BP 116/71 | HR 83 | Temp 98.1°F

## 2019-01-20 DIAGNOSIS — Z719 Counseling, unspecified: Secondary | ICD-10-CM

## 2019-01-20 NOTE — Progress Notes (Signed)
Botulinum Toxin Procedure Note  Procedure: Cosmetic botulinum toxin   Pre-operative Diagnosis: Dynamic rhytides   Post-operative Diagnosis: Same  Complications:  None  Brief history: The patient desires botulinum toxin injection of her forehead. I discussed with the patient this proposed procedure of botulinum toxin injections, which is customized depending on the particular needs of the patient. It is performed on facial rhytids as a temporary correction. The alternatives were discussed with the patient. The risks were addressed including bleeding, scarring, infection, damage to deeper structures, asymmetry, and chronic pain, which may occur infrequently after a procedure. The individual's choice to undergo a surgical procedure is based on the comparison of risks to potential benefits. Other risks include unsatisfactory results, brow ptosis, eyelid ptosis, allergic reaction, temporary paralysis, which should go away with time, bruising, blurring disturbances and delayed healing. Botulinum toxin injections do not arrest the aging process or produce permanent tightening of the eyelid.  Operative intervention maybe necessary to maintain the results of a blepharoplasty or botulinum toxin. The patient understands and wishes to proceed. An informed consent was signed and informational brochures given to her prior to the procedure.  Procedure: The area was prepped with alcohol and dried with a clean gauze. Using a clean technique, the botulinum toxin was diluted with 1.25 cc of preservative-free normal saline which was slowly injected with an 18 gauge needle in a tuberculin syringes.  A 32 gauge needles were then used to inject the botulinum toxin. This mixture allow for an aliquot of 5 units per 0.1 cc in each injection site.    Subsequently the mixture was injected in the glabellar and forehead area with preservation of the temporal branch to the lateral eyebrow as well as into each lateral canthal area  beginning from the lateral orbital rim medial to the zygomaticus major in 3 separate areas. A total of 30 Units of botulinum toxin was used. The forehead and glabellar area was injected with care to inject intramuscular only while holding pressure on the supratrochlear vessels in each area during each injection on either side of the medial corrugators. The injection proceeded vertically superiorly to the medial 2/3 of the frontalis muscle and superior 2/3 of the lateral frontalis, again with preservation of the frontal branch.  No complications were noted. Light pressure was held for 5 minutes. She was instructed explicitly in post-operative care.  Botox LOT:  [id number] C2 EXP:  7/23

## 2019-04-01 ENCOUNTER — Ambulatory Visit (INDEPENDENT_AMBULATORY_CARE_PROVIDER_SITE_OTHER): Payer: 59 | Admitting: Family

## 2019-04-01 ENCOUNTER — Encounter: Payer: Self-pay | Admitting: Family

## 2019-04-01 ENCOUNTER — Other Ambulatory Visit: Payer: Self-pay

## 2019-04-01 ENCOUNTER — Other Ambulatory Visit (HOSPITAL_COMMUNITY)
Admission: RE | Admit: 2019-04-01 | Discharge: 2019-04-01 | Disposition: A | Discharge status: Home / Self Care | Payer: 59 | Source: Ambulatory Visit | Attending: Family | Admitting: Family

## 2019-04-01 VITALS — BP 122/68 | HR 77 | Temp 96.6°F | Ht 63.0 in | Wt 160.6 lb

## 2019-04-01 DIAGNOSIS — R635 Abnormal weight gain: Secondary | ICD-10-CM | POA: Insufficient documentation

## 2019-04-01 DIAGNOSIS — Z0000 Encounter for general adult medical examination without abnormal findings: Secondary | ICD-10-CM

## 2019-04-01 MED ORDER — BUPROPION HCL ER (XL) 150 MG PO TB24: ORAL_TABLET | ORAL | 3 refills | Status: DC

## 2019-04-01 NOTE — Assessment & Plan Note (Signed)
Due for mammogram, this is been ordered and patient understands to schedule this.  Congratulated her on exercise.  She will continue to follow with dermatology.

## 2019-04-01 NOTE — Patient Instructions (Signed)
Stay safe  We discussed today starting medication called Wellbutrin.  As also discussed, you must limit alcohol on this medication as alcohol and Wellbutrin together may increase your risk for seizure.  You may drink no more than 1 alcoholic beverage on this medication.  A standard drink is 12 ounces of regular beer, which is usually about 5% alcohol OR 5 ounces of wine, which is typically about 12% alcohol OR   1.5 ounces of distilled spirits, which is about 40% alcohol    Health Maintenance, Female Adopting a healthy lifestyle and getting preventive care are important in promoting health and wellness. Ask your health care provider about:  The right schedule for you to have regular tests and exams.  Things you can do on your own to prevent diseases and keep yourself healthy. What should I know about diet, weight, and exercise? Eat a healthy diet   Eat a diet that includes plenty of vegetables, fruits, low-fat dairy products, and lean protein.  Do not eat a lot of foods that are high in solid fats, added sugars, or sodium. Maintain a healthy weight Body mass index (BMI) is used to identify weight problems. It estimates body fat based on height and weight. Your health care provider can help determine your BMI and help you achieve or maintain a healthy weight. Get regular exercise Get regular exercise. This is one of the most important things you can do for your health. Most adults should:  Exercise for at least 150 minutes each week. The exercise should increase your heart rate and make you sweat (moderate-intensity exercise).  Do strengthening exercises at least twice a week. This is in addition to the moderate-intensity exercise.  Spend less time sitting. Even light physical activity can be beneficial. Watch cholesterol and blood lipids Have your blood tested for lipids and cholesterol at 44 years of age, then have this test every 5 years. Have your cholesterol levels checked  more often if:  Your lipid or cholesterol levels are high.  You are older than 44 years of age.  You are at high risk for heart disease. What should I know about cancer screening? Depending on your health history and family history, you may need to have cancer screening at various ages. This may include screening for:  Breast cancer.  Cervical cancer.  Colorectal cancer.  Skin cancer.  Lung cancer. What should I know about heart disease, diabetes, and high blood pressure? Blood pressure and heart disease  High blood pressure causes heart disease and increases the risk of stroke. This is more likely to develop in people who have high blood pressure readings, are of African descent, or are overweight.  Have your blood pressure checked: ? Every 3-5 years if you are 54-5 years of age. ? Every year if you are 76 years old or older. Diabetes Have regular diabetes screenings. This checks your fasting blood sugar level. Have the screening done:  Once every three years after age 51 if you are at a normal weight and have a low risk for diabetes.  More often and at a younger age if you are overweight or have a high risk for diabetes. What should I know about preventing infection? Hepatitis B If you have a higher risk for hepatitis B, you should be screened for this virus. Talk with your health care provider to find out if you are at risk for hepatitis B infection. Hepatitis C Testing is recommended for:  Everyone born from [birth year] through 1965.  Anyone with known risk factors for hepatitis C. Sexually transmitted infections (STIs)  Get screened for STIs, including gonorrhea and chlamydia, if: ? You are sexually active and are younger than 44 years of age. ? You are older than 44 years of age and your health care provider tells you that you are at risk for this type of infection. ? Your sexual activity has changed since you were last screened, and you are at increased risk for  chlamydia or gonorrhea. Ask your health care provider if you are at risk.  Ask your health care provider about whether you are at high risk for HIV. Your health care provider may recommend a prescription medicine to help prevent HIV infection. If you choose to take medicine to prevent HIV, you should first get tested for HIV. You should then be tested every 3 months for as long as you are taking the medicine. Pregnancy  If you are about to stop having your period (premenopausal) and you may become pregnant, seek counseling before you get pregnant.  Take 400 to 800 micrograms (mcg) of folic acid every day if you become pregnant.  Ask for birth control (contraception) if you want to prevent pregnancy. Osteoporosis and menopause Osteoporosis is a disease in which the bones lose minerals and strength with aging. This can result in bone fractures. If you are 66 years old or older, or if you are at risk for osteoporosis and fractures, ask your health care provider if you should:  Be screened for bone loss.  Take a calcium or vitamin D supplement to lower your risk of fractures.  Be given hormone replacement therapy (HRT) to treat symptoms of menopause. Follow these instructions at home: Lifestyle  Do not use any products that contain nicotine or tobacco, such as cigarettes, e-cigarettes, and chewing tobacco. If you need help quitting, ask your health care provider.  Do not use street drugs.  Do not share needles.  Ask your health care provider for help if you need support or information about quitting drugs. Alcohol use  Do not drink alcohol if: ? Your health care provider tells you not to drink. ? You are pregnant, may be pregnant, or are planning to become pregnant.  If you drink alcohol: ? Limit how much you use to 0-1 drink a day. ? Limit intake if you are breastfeeding.  Be aware of how much alcohol is in your drink. In the [place] one drink equals one 12 oz bottle of beer (355  mL), one 5 oz glass of wine (148 mL), or one 1 oz glass of hard liquor (44 mL). General instructions  Schedule regular health, dental, and eye exams.  Stay current with your vaccines.  Tell your health care provider if: ? You often feel depressed. ? You have ever been abused or do not feel safe at home. Summary  Adopting a healthy lifestyle and getting preventive care are important in promoting health and wellness.  Follow your health care provider's instructions about healthy diet, exercising, and getting tested or screened for diseases.  Follow your health care provider's instructions on monitoring your cholesterol and blood pressure. This information is not intended to replace advice given to you by your health care provider. Make sure you discuss any questions you have with your health care provider. Document Revised: 02/05/2018 Document Reviewed: 02/05/2018 Elsevier Patient Education  2020 [employer].

## 2019-04-01 NOTE — Assessment & Plan Note (Signed)
Agreed that restarting Wellbutrin is appropriate.  Patient will let me know if any concerns.

## 2019-04-01 NOTE — Progress Notes (Signed)
Subjective:    Patient ID: Christine Petty, female    DOB: 01/11/1976, 44 y.o.   MRN: [id number]  CC: Christine Petty is a 44 y.o. female who presents today for physical exam.    HPI: Feels well today.  Only concern is weight gain during pandemic. Particularly when gyms were closed. Working out again 3-4 per week.Boot camp. No cp, sob.  Would like to restart wellbutrin.  No history of heavy alcohol use, seizures or eating disorder   Colorectal Cancer Screening: no family history Breast Cancer Screening: Mammogram due Cervical Cancer Screening: due; no pelvic pain, abdominal distention.  Normal monthly menses   Labs: Screening labs today. Exercise: Gets regular exercise.  Alcohol use: Occasional   smoking/tobacco use: Nonsmoker.  Follows dermatology, Dr. Nehemiah Massed  HISTORY:  Past Medical History:  Diagnosis Date  . Cancer ([place]) 2010   Skin Cancer  . History of frequent urinary tract infections   . Melanoma [facility])     Past Surgical History:  Procedure Laterality Date  . CESAREAN SECTION  2007  . EXCISION MELANOMA WITH SENTINEL LYMPH NODE BIOPSY  2010  . TONSILLECTOMY AND ADENOIDECTOMY  2009   Family History  Problem Relation Age of Onset  . Cancer Mother 81       Breast Cancer  . Hyperlipidemia Mother   . Hypertension Mother   . Breast cancer Mother 18  . Hyperlipidemia Father   . Hypertension Father   . Breast cancer Paternal Aunt 31  . Colon cancer Neg Hx       ALLERGIES: Patient has no known allergies.  No current outpatient medications on file prior to visit.   No current facility-administered medications on file prior to visit.    Social History   Tobacco Use  . Smoking status: Never Smoker  . Smokeless tobacco: Never Used  Substance Use Topics  . Alcohol use: Yes    Alcohol/week: 0.0 standard drinks    Comment: Rare  . Drug use: No    Review of Systems  Constitutional: Negative for chills, fever and unexpected weight change.  HENT: Negative for  congestion.   Respiratory: Negative for cough.   Cardiovascular: Negative for chest pain, palpitations and leg swelling.  Gastrointestinal: Negative for nausea and vomiting.  Genitourinary: Negative for pelvic pain and vaginal pain.  Musculoskeletal: Negative for arthralgias and myalgias.  Skin: Negative for rash.  Neurological: Negative for headaches.  Hematological: Negative for adenopathy.  Psychiatric/Behavioral: Negative for confusion.      Objective:    BP 122/68   Pulse 77   Temp (!) 96.6 F (35.9 C) (Temporal)   Ht 5\' 3"  (1.6 m)   Wt 160 lb 9.6 oz (72.8 kg)   SpO2 99%   BMI 28.45 kg/m   BP Readings from Last 3 Encounters:  04/01/19 122/68  01/20/19 116/71  08/05/18 117/74   Wt Readings from Last 3 Encounters:  04/01/19 160 lb 9.6 oz (72.8 kg)  08/05/18 158 lb (71.7 kg)  04/25/18 147 lb 9.6 oz (67 kg)    Physical Exam Vitals reviewed.  Constitutional:      Appearance: She is well-developed.  Eyes:     Conjunctiva/sclera: Conjunctivae normal.  Neck:     Thyroid: No thyroid mass or thyromegaly.  Cardiovascular:     Rate and Rhythm: Normal rate and regular rhythm.     Pulses: Normal pulses.     Heart sounds: Normal heart sounds.  Pulmonary:     Effort: Pulmonary effort is  normal.     Breath sounds: Normal breath sounds. No wheezing, rhonchi or rales.  Chest:     Breasts: Breasts are symmetrical.        Right: No inverted nipple, mass, nipple discharge, skin change or tenderness.        Left: No inverted nipple, mass, nipple discharge, skin change or tenderness.  Genitourinary:    Cervix: No cervical motion tenderness, discharge or friability.     Uterus: Not enlarged, not fixed and not tender.      Adnexa:        Right: No mass, tenderness or fullness.         Left: No mass, tenderness or fullness.       Comments: Pap performed. No CMT. Unable to appreciated ovaries. Lymphadenopathy:     Head:     Right side of head: No submental, submandibular,  tonsillar, preauricular, posterior auricular or occipital adenopathy.     Left side of head: No submental, submandibular, tonsillar, preauricular, posterior auricular or occipital adenopathy.     Cervical:     Right cervical: No superficial, deep or posterior cervical adenopathy.    Left cervical: No superficial, deep or posterior cervical adenopathy.     Upper Body:     Right upper body: No pectoral adenopathy.     Left upper body: No pectoral adenopathy.  Skin:    General: Skin is warm and dry.  Neurological:     Mental Status: She is alert.  Psychiatric:        Speech: Speech normal.        Behavior: Behavior normal.        Thought Content: Thought content normal.        Assessment & Plan:   Problem List Items Addressed This Visit      Other   Annual physical exam - Primary    Due for mammogram, this is been ordered and patient understands to schedule this.  Congratulated her on exercise.  She will continue to follow with dermatology.      Relevant Orders   MM 3D SCREEN BREAST BILATERAL   Cytology - PAP   TSH   CBC with Differential/Platelet   Comprehensive metabolic panel   Hemoglobin A1c   Lipid panel   VITAMIN D 25 Hydroxy (Vit-D Deficiency, Fractures)   Weight gain    Agreed that restarting Wellbutrin is appropriate.  Patient will let me know if any concerns.       Relevant Medications   buPROPion (WELLBUTRIN XL) 150 MG 24 hr tablet       I am having Christine Petty start on buPROPion.   Meds ordered this encounter  Medications  . buPROPion (WELLBUTRIN XL) 150 MG 24 hr tablet    Sig: Start 150 mg ER PO qam, increase after 3 days to 300 mg qam.    Dispense:  60 tablet    Refill:  3    Order Specific Question:   Supervising Provider    Answer:   Christine Petty [[id number]]    Return precautions given.   Risks, benefits, and alternatives of the medications and treatment plan prescribed today were discussed, and patient expressed understanding.    Education regarding symptom management and diagnosis given to patient on AVS.   Continue to follow with Burnard Hawthorne, FNP for routine health maintenance.   Christine Petty and I agreed with plan.   Christine Paris, FNP

## 2019-04-02 LAB — CBC WITH DIFFERENTIAL/PLATELET
Basophils Absolute: 0.1 10*3/uL (ref 0.0–0.1)
Basophils Relative: 1.3 % (ref 0.0–3.0)
Eosinophils Absolute: 0.1 10*3/uL (ref 0.0–0.7)
Eosinophils Relative: 0.8 % (ref 0.0–5.0)
HCT: 38 % (ref 36.0–46.0)
Hemoglobin: 12.8 g/dL (ref 12.0–15.0)
Lymphocytes Relative: 33.2 % (ref 12.0–46.0)
Lymphs Abs: 2.4 10*3/uL (ref 0.7–4.0)
MCHC: 33.6 g/dL (ref 30.0–36.0)
MCV: 96.2 fl (ref 78.0–100.0)
Monocytes Absolute: 0.5 10*3/uL (ref 0.1–1.0)
Monocytes Relative: 6.8 % (ref 3.0–12.0)
Neutro Abs: 4.3 10*3/uL (ref 1.4–7.7)
Neutrophils Relative %: 57.9 % (ref 43.0–77.0)
Platelets: 305 10*3/uL (ref 150.0–400.0)
RBC: 3.95 Mil/uL (ref 3.87–5.11)
RDW: 12.5 % (ref 11.5–15.5)
WBC: 7.3 10*3/uL (ref 4.0–10.5)

## 2019-04-02 LAB — COMPREHENSIVE METABOLIC PANEL
ALT: 13 U/L (ref 0–35)
AST: 16 U/L (ref 0–37)
Albumin: 4.3 g/dL (ref 3.5–5.2)
Alkaline Phosphatase: 46 U/L (ref 39–117)
BUN: 15 mg/dL (ref 6–23)
CO2: 25 mEq/L (ref 19–32)
Calcium: 9.3 mg/dL (ref 8.4–10.5)
Chloride: 105 mEq/L (ref 96–112)
Creatinine, Ser: 0.66 mg/dL (ref 0.40–1.20)
GFR: 97.34 mL/min (ref >60.00)
Glucose, Bld: 79 mg/dL (ref 70–99)
Potassium: 3.9 mEq/L (ref 3.5–5.1)
Sodium: 137 mEq/L (ref 135–145)
Total Bilirubin: 1.4 mg/dL — ABNORMAL HIGH (ref 0.2–1.2)
Total Protein: 7.4 g/dL (ref 6.0–8.3)

## 2019-04-02 LAB — LIPID PANEL
Cholesterol: 220 mg/dL — ABNORMAL HIGH (ref 0–200)
HDL: 52.9 mg/dL (ref >39.00)
LDL Cholesterol: 155 mg/dL — ABNORMAL HIGH (ref 0–99)
NonHDL: 167.42
Total CHOL/HDL Ratio: 4
Triglycerides: 60 mg/dL (ref 0.0–149.0)
VLDL: 12 mg/dL (ref 0.0–40.0)

## 2019-04-02 LAB — VITAMIN D 25 HYDROXY (VIT D DEFICIENCY, FRACTURES): VITD: 21.78 ng/mL — ABNORMAL LOW (ref 30.00–100.00)

## 2019-04-02 LAB — TSH: TSH: 2.22 u[IU]/mL (ref 0.35–4.50)

## 2019-04-02 LAB — HEMOGLOBIN A1C: Hgb A1c MFr Bld: 5 % (ref 4.6–6.5)

## 2019-04-03 LAB — CYTOLOGY - PAP
Comment: NEGATIVE
Diagnosis: NEGATIVE
High risk HPV: NEGATIVE

## 2019-04-09 DIAGNOSIS — Z86018 Personal history of other benign neoplasm: Secondary | ICD-10-CM | POA: Diagnosis not present

## 2019-04-09 DIAGNOSIS — L988 Other specified disorders of the skin and subcutaneous tissue: Secondary | ICD-10-CM | POA: Diagnosis not present

## 2019-04-09 DIAGNOSIS — Z1283 Encounter for screening for malignant neoplasm of skin: Secondary | ICD-10-CM | POA: Diagnosis not present

## 2019-04-09 DIAGNOSIS — D225 Melanocytic nevi of trunk: Secondary | ICD-10-CM | POA: Diagnosis not present

## 2019-04-09 DIAGNOSIS — Z85828 Personal history of other malignant neoplasm of skin: Secondary | ICD-10-CM | POA: Diagnosis not present

## 2019-04-09 DIAGNOSIS — Z85820 Personal history of malignant melanoma of skin: Secondary | ICD-10-CM | POA: Diagnosis not present

## 2019-04-09 DIAGNOSIS — D485 Neoplasm of uncertain behavior of skin: Secondary | ICD-10-CM | POA: Diagnosis not present

## 2019-04-21 ENCOUNTER — Encounter: Payer: 59 | Admitting: Plastic Surgery

## 2019-04-28 ENCOUNTER — Encounter: Payer: Self-pay | Admitting: Plastic Surgery

## 2019-04-28 ENCOUNTER — Other Ambulatory Visit: Payer: Self-pay

## 2019-04-28 ENCOUNTER — Ambulatory Visit (INDEPENDENT_AMBULATORY_CARE_PROVIDER_SITE_OTHER): Payer: Self-pay | Admitting: Plastic Surgery

## 2019-04-28 VITALS — BP 113/77 | HR 79 | Temp 98.4°F | Ht 63.0 in | Wt 163.0 lb

## 2019-04-28 DIAGNOSIS — Z719 Counseling, unspecified: Secondary | ICD-10-CM

## 2019-04-28 NOTE — Progress Notes (Signed)

## 2019-05-07 ENCOUNTER — Ambulatory Visit
Admission: RE | Admit: 2019-05-07 | Discharge: 2019-05-07 | Disposition: A | Discharge status: Home / Self Care | Payer: 59 | Source: Ambulatory Visit | Attending: Family | Admitting: Family

## 2019-05-07 DIAGNOSIS — Z1231 Encounter for screening mammogram for malignant neoplasm of breast: Secondary | ICD-10-CM | POA: Insufficient documentation

## 2019-05-07 DIAGNOSIS — Z0000 Encounter for general adult medical examination without abnormal findings: Secondary | ICD-10-CM

## 2019-07-01 DIAGNOSIS — H52223 Regular astigmatism, bilateral: Secondary | ICD-10-CM | POA: Diagnosis not present

## 2019-07-01 DIAGNOSIS — H5213 Myopia, bilateral: Secondary | ICD-10-CM | POA: Diagnosis not present

## 2019-07-21 ENCOUNTER — Ambulatory Visit (INDEPENDENT_AMBULATORY_CARE_PROVIDER_SITE_OTHER): Payer: Self-pay | Admitting: Plastic Surgery

## 2019-07-21 ENCOUNTER — Encounter: Payer: Self-pay | Admitting: Plastic Surgery

## 2019-07-21 ENCOUNTER — Other Ambulatory Visit: Payer: Self-pay

## 2019-07-21 VITALS — BP 107/75 | HR 74 | Temp 97.8°F | Ht 63.0 in | Wt 163.0 lb

## 2019-07-21 DIAGNOSIS — Z719 Counseling, unspecified: Secondary | ICD-10-CM

## 2019-07-21 NOTE — Progress Notes (Signed)

## 2019-09-30 ENCOUNTER — Encounter: Payer: Self-pay | Admitting: Dermatology

## 2019-09-30 ENCOUNTER — Other Ambulatory Visit: Payer: Self-pay

## 2019-09-30 ENCOUNTER — Ambulatory Visit: Payer: 59 | Admitting: Dermatology

## 2019-09-30 DIAGNOSIS — L821 Other seborrheic keratosis: Secondary | ICD-10-CM

## 2019-09-30 DIAGNOSIS — L578 Other skin changes due to chronic exposure to nonionizing radiation: Secondary | ICD-10-CM

## 2019-09-30 DIAGNOSIS — L814 Other melanin hyperpigmentation: Secondary | ICD-10-CM | POA: Diagnosis not present

## 2019-09-30 DIAGNOSIS — D229 Melanocytic nevi, unspecified: Secondary | ICD-10-CM

## 2019-09-30 DIAGNOSIS — Z86018 Personal history of other benign neoplasm: Secondary | ICD-10-CM

## 2019-09-30 DIAGNOSIS — Z85820 Personal history of malignant melanoma of skin: Secondary | ICD-10-CM | POA: Diagnosis not present

## 2019-09-30 DIAGNOSIS — D1800 Hemangioma unspecified site: Secondary | ICD-10-CM

## 2019-09-30 DIAGNOSIS — Z85828 Personal history of other malignant neoplasm of skin: Secondary | ICD-10-CM | POA: Diagnosis not present

## 2019-09-30 DIAGNOSIS — Z1283 Encounter for screening for malignant neoplasm of skin: Secondary | ICD-10-CM | POA: Diagnosis not present

## 2019-09-30 NOTE — Progress Notes (Signed)
   Follow-Up Visit   Subjective  Christine Petty is a 44 y.o. female who presents for the following: Annual Exam (Upper body skin exam, hx of multiple dysplastic and Melanoma R lat neck, hx of BCC/s). The patient presents for Upper Body Skin Exam (UBSE) for skin cancer screening and mole check.  The following portions of the chart were reviewed this encounter and updated as appropriate:  Tobacco  Allergies  Meds  Problems  Med Hx  Surg Hx  Fam Hx     Review of Systems:  No other skin or systemic complaints except as noted in HPI or Assessment and Plan.  Objective  Well appearing patient in no apparent distress; mood and affect are within normal limits.  All skin waist up examined.  Objective  R lat neck: Well healed scar with no evidence of recurrence, no lymphadenopathy.   Objective  Multiple: Scars with no evidence of recurrence.   Objective  R lat forhead, R lat canthus: Well healed scars with no evidence of recurrence.    Assessment & Plan  History of melanoma R lat neck  From 08/16/2008 Clear. Observe for recurrence. Call clinic for new or changing lesions.  Recommend regular skin exams, daily broad-spectrum spf 30+ sunscreen use, and photoprotection.     History of dysplastic nevus Multiple  Clear, observe for changes   History of basal cell carcinoma (BCC) R lat forhead, R lat canthus  Clear, observe for changes    Lentigines - Scattered tan macules - Discussed due to sun exposure - Benign, observe - Call for any changes  Seborrheic Keratoses - Stuck-on, waxy, tan-brown papules and plaques  - Discussed benign etiology and prognosis. - Observe - Call for any changes  Melanocytic Nevi - Tan-brown and/or pink-flesh-colored symmetric macules and papules - Benign appearing on exam today - Observation - Call clinic for new or changing moles - Recommend daily use of broad spectrum spf 30+ sunscreen to sun-exposed areas.   Hemangiomas - Red  papules - Discussed benign nature - Observe - Call for any changes  Actinic Damage - diffuse scaly erythematous macules with underlying dyspigmentation - Recommend daily broad spectrum sunscreen SPF 30+ to sun-exposed areas, reapply every 2 hours as needed.  - Call for new or changing lesions.  Skin cancer screening performed today.  Return in about 6 months (around 04/01/2020) for TBSE, hx of Melanoma, BCC, Dysplastic.  I, Othelia Pulling, RMA, am acting as scribe for Sarina Ser, MD .  Documentation: I have reviewed the above documentation for accuracy and completeness, and I agree with the above.  Sarina Ser, MD

## 2019-10-01 ENCOUNTER — Encounter: Payer: Self-pay | Admitting: Dermatology

## 2019-10-27 ENCOUNTER — Ambulatory Visit (INDEPENDENT_AMBULATORY_CARE_PROVIDER_SITE_OTHER): Payer: Self-pay | Admitting: Plastic Surgery

## 2019-10-27 ENCOUNTER — Other Ambulatory Visit: Payer: Self-pay

## 2019-10-27 ENCOUNTER — Encounter: Payer: Self-pay | Admitting: Plastic Surgery

## 2019-10-27 VITALS — BP 106/69 | HR 74 | Temp 98.4°F

## 2019-10-27 DIAGNOSIS — Z719 Counseling, unspecified: Secondary | ICD-10-CM

## 2019-10-27 NOTE — Progress Notes (Signed)

## 2019-12-27 ENCOUNTER — Other Ambulatory Visit: Payer: Self-pay

## 2019-12-27 ENCOUNTER — Ambulatory Visit
Admission: EM | Admit: 2019-12-27 | Discharge: 2019-12-27 | Disposition: A | Discharge status: Home / Self Care | Payer: 59 | Attending: Student | Admitting: Student

## 2019-12-27 ENCOUNTER — Encounter: Payer: Self-pay | Admitting: Emergency Medicine

## 2019-12-27 DIAGNOSIS — L0100 Impetigo, unspecified: Secondary | ICD-10-CM

## 2019-12-27 MED ORDER — CEPHALEXIN 500 MG PO CAPS: 500.0000 mg | ORAL_CAPSULE | Freq: Four times a day (QID) | ORAL | 0 refills | Status: DC

## 2019-12-27 NOTE — ED Provider Notes (Signed)
[facility]    CSN: [id number] Arrival date & time: 12/27/19  0805      History   Chief Complaint Chief Complaint  Patient presents with  . lip infection   HPI Christine Petty is a 44 y.o. female who presents today for possible lip infection.  Patient states that over the past 2 weeks she has had excessive dryness of both her upper and lower lips, while the dryness has improved she states that over the past several days she has noticed a rash appear along both her upper and lower lips with some noticeable drainage.  She denies any fevers or chills at home, denies any sore throat, cough or postnasal drip.  HPI  Past Medical History:  Diagnosis Date  . Basal cell carcinoma 03/10/2013   Right lateral forehead.   . Basal cell carcinoma 09/08/2013   Right lat. canthus. Superficial.  . Cancer ([place]) 2010   Skin Cancer  . Dysplastic nevi 09/07/2009   Left low back 7cm lat to spine, left low back sup. to waistline 6cm lat to spine, right mid popliteal; minimal atypia  . Dysplastic nevi 12/19/2009   Left inf. med scapula 6.0cm lat to spine, right mid to low back paraspinal 1.5cm lat to spine, right low back paraspinal above waistline; Moderate atypia.   Marland Kitchen Dysplastic nevi 04/22/2014   Right low scapula reshaved 05/25/2015 margins free, left low back paraspinal above sacral area. Mild atypia.  Marland Kitchen Dysplastic nevi 11/28/2015   Left medial cheek buttock midway, right mid posterior thigh closest to midline; mild atypia.   Marland Kitchen Dysplastic nevi 03/24/2018   Left epigastric, left lateral near side superior and parallel to umbilicus upper quadrant; moderate atypia.   Marland Kitchen Dysplastic nevus 08/17/2019   Right post. waistline. Moderate to marked atypia.   Marland Kitchen Dysplastic nevus 01/24/2009   RUQ abdomen. Moderate atypia  . Dysplastic nevus 01/24/2009   Right low back med. Slight to moderate atypia  . Dysplastic nevus 01/24/2009   Right low back lat. near side. Slight to moderate atypia  .  Dysplastic nevus 01/24/2009   Mid to upper back spinal.   . Dysplastic nevus 04/25/2009   Left costal margin. Moderate to severe atypia. Excised: 06/01/2009. Residual DN, edges free.  Marland Kitchen Dysplastic nevus 04/25/2009   Right epigastric costal margin. Moderate to severe atypia. Excised: 05/25/2009. Margins free.  Marland Kitchen Dysplastic nevus 04/25/2009   Right mid to low back paraspinal. Moderate atypia.  Marland Kitchen Dysplastic nevus 07/18/2009   Left epigastric costal margin inf. Mild atypia.  Marland Kitchen Dysplastic nevus 07/18/2009   Right med. inf. scapula. Moderate to severe atypia. Excised: 08/31/2009, margins free.  Marland Kitchen Dysplastic nevus 07/18/2009   Right med sup. scapula. Moderate to severe atypia. Excised: 08/31/2009, margins free.  Marland Kitchen Dysplastic nevus 09/07/2009   Right lat. inf. deltoid. Mild atypia.  Marland Kitchen Dysplastic nevus 12/19/2009   Left mid to low back inf. to braline 9.5cm lat to spine. Moderate to severe atypia. Excised: 02/01/2010, margins free.  Marland Kitchen Dysplastic nevus 09/01/2012   Left distal bicep (near antecubital). Mild atypia.  Marland Kitchen Dysplastic nevus 03/10/2013   Right ant. lat. hip. Moderate atypia.   Marland Kitchen Dysplastic nevus 09/08/2013   Right inframammary sternum. Mild melanocytic atypia.   Marland Kitchen Dysplastic nevus 05/25/2015   Low back. Mild atypia  . Dysplastic nevus 05/28/2016   Left low back. Halo nevus with moderate atypia, margin involved.  Marland Kitchen Dysplastic nevus 01/30/2017   Right pubic area. Severe atypia. Re-shaved 04/11/2017. Persistent DN, margins free.  Marland Kitchen  Dysplastic nevus 01/30/2017   Right mid side. Mild atypia.  Marland Kitchen Dysplastic nevus 08/22/2017   Left inf. lat. buttocks. Mild atypia, limited margins free.  Marland Kitchen Dysplastic nevus 09/08/2018   Left low back near side. Mild atypia, deep margin involved. Re-shaved: 04/09/2019, recurrent DN, margins free.  Marland Kitchen History of frequent urinary tract infections   . Melanoma ([place]) 08/16/2008   Right lateral neck. MM, Clark's level IV, Breslow's 0.75mm. Type: Nevoid.    Patient  Active Problem List   Diagnosis Date Noted  . Weight gain 04/01/2019  . Gilbert syndrome 02/02/2018  . Annual physical exam 09/28/2014  . Encounter for counseling 09/28/2014  . History of melanoma 09/28/2014   Past Surgical History:  Procedure Laterality Date  . CESAREAN SECTION  2007  . EXCISION MELANOMA WITH SENTINEL LYMPH NODE BIOPSY  2010  . TONSILLECTOMY AND ADENOIDECTOMY  2009   OB History   No obstetric history on file.    Home Medications    Prior to Admission medications   Medication Sig Start Date End Date Taking? Authorizing Provider  cephALEXin (KEFLEX) 500 MG capsule Take 1 capsule (500 mg total) by mouth 4 (four) times daily. 12/27/19   Lattie Corns, PA-C  buPROPion (WELLBUTRIN XL) 150 MG 24 hr tablet Start 150 mg ER PO qam, increase after 3 days to 300 mg qam. 04/01/19 12/27/19  Burnard Hawthorne, FNP    Family History Family History  Problem Relation Age of Onset  . Cancer Mother 18       Breast Cancer  . Hyperlipidemia Mother   . Hypertension Mother   . Breast cancer Mother 35  . Hyperlipidemia Father   . Hypertension Father   . Breast cancer Paternal Aunt 56  . Colon cancer Neg Hx     Social History Social History   Tobacco Use  . Smoking status: Never Smoker  . Smokeless tobacco: Never Used  Vaping Use  . Vaping Use: Never used  Substance Use Topics  . Alcohol use: Yes    Alcohol/week: 0.0 standard drinks    Comment: Rare  . Drug use: No   Allergies   Patient has no known allergies.  Review of Systems Review of Systems  Constitutional: Negative.   HENT:       Lip irritation   Eyes: Negative.   Respiratory: Negative.   Cardiovascular: Negative.   Gastrointestinal: Negative.   Endocrine: Negative.   Genitourinary: Negative.   Musculoskeletal: Negative.   Skin: Positive for rash.  Allergic/Immunologic: Negative.   Neurological: Negative.   Hematological: Negative.   Psychiatric/Behavioral: Negative.    Physical  Exam Triage Vital Signs ED Triage Vitals  Enc Vitals Group     BP 12/27/19 0816 122/60     Pulse Rate 12/27/19 0816 72     Resp 12/27/19 0816 18     Temp 12/27/19 0816 98.4 F (36.9 C)     Temp Source 12/27/19 0816 Oral     SpO2 12/27/19 0816 98 %     Weight 12/27/19 0815 160 lb (72.6 kg)     Height 12/27/19 0815 5\' 3"  (1.6 m)     Head Circumference --      Peak Flow --      Pain Score 12/27/19 0815 0     Pain Loc --      Pain Edu? --      Excl. in [place]? --    No data found.  Updated Vital Signs BP 122/60 (BP Location: Left Arm)  Pulse 72   Temp 98.4 F (36.9 C) (Oral)   Resp 18   Ht 5\' 3"  (1.6 m)   Wt 160 lb (72.6 kg)   LMP 12/13/2019 (Approximate)   SpO2 98%   BMI 28.34 kg/m   Visual Acuity Right Eye Distance:   Left Eye Distance:   Bilateral Distance:    Right Eye Near:   Left Eye Near:    Bilateral Near:     Physical Exam Constitutional:      General: She is not in acute distress.    Appearance: She is not ill-appearing.  HENT:     Nose: Nose normal. No congestion or rhinorrhea.     Mouth/Throat:     Mouth: Mucous membranes are moist.     Pharynx: Oropharynx is clear. No oropharyngeal exudate or posterior oropharyngeal erythema.     Comments: Skin examination of the patient's lips does reveal some crusting present along the lower and upper lip with yellowish clear drainage Neurological:     Mental Status: She is alert.    UC Treatments / Results  Labs (all labs ordered are listed, but only abnormal results are displayed) Labs Reviewed - No data to display  EKG   Radiology No results found.  Procedures Procedures (including critical care time)  Medications Ordered in UC Medications - No data to display  Initial Impression / Assessment and Plan / UC Course  I have reviewed the triage vital signs and the nursing notes.  Pertinent labs & imaging results that were available during my care of the patient were reviewed by me and considered in  my medical decision making (see chart for details).     1.  Treatment options were discussed today with the patient. 2.  The patient's appearance around her lips does appear to be more irritative however with crusting and some yellow drainage could be developing mild impetigo. 3.  We will cover with Keflex for 1 week, also instructed the patient to use Neosporin lip balm daily. 4.  Follow-up with primary care physician if symptoms fail to improve. Final Clinical Impressions(s) / UC Diagnoses   Final diagnoses:  Impetigo     Discharge Instructions     Take oral antibiotic as directed. While taking oral antibiotic can also use Neosporin lip balm daily. Avoid excessive licking of lips as this can increase dryness and irritation.   ED Prescriptions    Medication Sig Dispense Auth. Provider   cephALEXin (KEFLEX) 500 MG capsule Take 1 capsule (500 mg total) by mouth 4 (four) times daily. 28 capsule Lattie Corns, PA-C     PDMP not reviewed this encounter.   Lattie Corns, [place] 12/27/19 [phone]

## 2019-12-27 NOTE — Discharge Instructions (Addendum)
Take oral antibiotic as directed. While taking oral antibiotic can also use Neosporin lip balm daily. Avoid excessive licking of lips as this can increase dryness and irritation.

## 2019-12-27 NOTE — ED Triage Notes (Signed)
Patient in today c/o "lip infection" on her bottom lip x 2 days. Patient states her lips were chapped really bad ~2 weeks ago and now her bottom lip is "crusty" along the outer edge.

## 2020-01-05 ENCOUNTER — Other Ambulatory Visit: Payer: Self-pay | Admitting: General Surgery

## 2020-01-05 MED ORDER — CEPHALEXIN 500 MG PO CAPS: 500.0000 mg | ORAL_CAPSULE | Freq: Four times a day (QID) | ORAL | 0 refills | Status: DC

## 2020-01-13 ENCOUNTER — Ambulatory Visit: Payer: 59 | Admitting: Dermatology

## 2020-01-13 ENCOUNTER — Other Ambulatory Visit: Payer: Self-pay

## 2020-01-13 DIAGNOSIS — L0100 Impetigo, unspecified: Secondary | ICD-10-CM

## 2020-01-13 MED ORDER — MUPIROCIN 2 % EX OINT: TOPICAL_OINTMENT | CUTANEOUS | 11 refills | Status: DC

## 2020-01-13 NOTE — Progress Notes (Signed)
   Follow-Up Visit   Subjective  Christine Petty is a 44 y.o. female who presents for the following: Rash (Pt c/o blistery rash on her lip appeared about 2 weeks ago, pt went to urgent care and was diagnosed with Impetigo, prescribed Cephalexin 500 mg 4 times a day x 10 days, pt here for a second opinoin ).  The following portions of the chart were reviewed this encounter and updated as appropriate:  Tobacco  Allergies  Meds  Problems  Med Hx  Surg Hx  Fam Hx     Review of Systems:  No other skin or systemic complaints except as noted in HPI or Assessment and Plan.  Objective  Well appearing patient in no apparent distress; mood and affect are within normal limits.  A focused examination was performed including face. Relevant physical exam findings are noted in the Assessment and Plan.  Objective  Lips: Redness   Images       Assessment & Plan  Impetigo - severe in perioral area; now resolving after treatment Lips Start Mupirocin ointment apply to inside nose at bedtime X 2 weeks, then decrease to weekends only   Start Mupirocin ointment apply to lips bid  x 1 month   May consider Bacterial culture if recurs;  may add Doxycycline tablets if recurs  Other Related Medications mupirocin ointment (BACTROBAN) 2 %  Return if symptoms worsen or fail to improve, for TBSE keep scheduled appt in Feb 2022.  IMarye Round, CMA, am acting as scribe for Sarina Ser, MD .  Documentation: I have reviewed the above documentation for accuracy and completeness, and I agree with the above.  Sarina Ser, MD

## 2020-01-18 ENCOUNTER — Encounter: Payer: Self-pay | Admitting: Dermatology

## 2020-02-02 ENCOUNTER — Encounter: Payer: Self-pay | Admitting: Dermatology

## 2020-02-04 ENCOUNTER — Other Ambulatory Visit: Payer: Self-pay

## 2020-02-04 MED ORDER — DOXYCYCLINE HYCLATE 100 MG PO TABS: 100.0000 mg | ORAL_TABLET | Freq: Two times a day (BID) | ORAL | 1 refills | Status: DC

## 2020-02-04 NOTE — Progress Notes (Signed)
See MyChart message - per Dr. Nehemiah Massed send in Doxycycline BID #20 1RF.

## 2020-02-16 ENCOUNTER — Ambulatory Visit (INDEPENDENT_AMBULATORY_CARE_PROVIDER_SITE_OTHER): Payer: Self-pay | Admitting: Plastic Surgery

## 2020-02-16 ENCOUNTER — Encounter: Payer: Self-pay | Admitting: Plastic Surgery

## 2020-02-16 ENCOUNTER — Other Ambulatory Visit: Payer: Self-pay

## 2020-02-16 DIAGNOSIS — Z719 Counseling, unspecified: Secondary | ICD-10-CM

## 2020-02-16 NOTE — Progress Notes (Signed)
Botulinum Toxin Procedure Note  Procedure: Cosmetic botulinum toxin   Pre-operative Diagnosis: Dynamic rhytides   Post-operative Diagnosis: Same  Complications:  None  Brief history: The patient desires botulinum toxin injection of her forehead. I discussed with the patient this proposed procedure of botulinum toxin injections, which is customized depending on the particular needs of the patient. It is performed on facial rhytids as a temporary correction. The alternatives were discussed with the patient. The risks were addressed including bleeding, scarring, infection, damage to deeper structures, asymmetry, and chronic pain, which may occur infrequently after a procedure. The individual's choice to undergo a surgical procedure is based on the comparison of risks to potential benefits. Other risks include unsatisfactory results, brow ptosis, eyelid ptosis, allergic reaction, temporary paralysis, which should go away with time, bruising, blurring disturbances and delayed healing. Botulinum toxin injections do not arrest the aging process or produce permanent tightening of the eyelid.  Operative intervention maybe necessary to maintain the results of a blepharoplasty or botulinum toxin. The patient understands and wishes to proceed.  Procedure: The area was prepped with alcohol and dried with a clean gauze. Using a clean technique, the botulinum toxin was diluted with 1.25 cc of preservative-free normal saline which was slowly injected with an 18 gauge needle in a tuberculin syringes.  A 32 gauge needles were then used to inject the botulinum toxin. This mixture allow for an aliquot of 5 units per 0.1 cc in each injection site.    Subsequently the mixture was injected in the glabellar and forehead area with preservation of the temporal branch to the lateral eyebrow as well as into each lateral canthal area beginning from the lateral orbital rim medial to the zygomaticus major in 3 separate areas. A  total of 24 Units of botulinum toxin was used. The forehead and glabellar area was injected with care to inject intramuscular only while holding pressure on the supratrochlear vessels in each area during each injection on either side of the medial corrugators. The injection proceeded vertically superiorly to the medial 2/3 of the frontalis muscle and superior 2/3 of the lateral frontalis, again with preservation of the frontal branch.  The midface area was injected at the 3 sub-regions of the mid-face: zygomaticomalar region, anteromedial cheek region, and submalar region for a total of one syringe on each side of the face. The technique used was serial puncture with equal injections in the 3 sub-regions: the zygomaticomalar region, the anteromedial cheek, and the submalar region.  No complications were noted. Light pressure was held for 5 minutes. She was instructed explicitly in post-operative care.  Botox LOT:  [id number] C2 EXP:  3/24

## 2020-03-08 ENCOUNTER — Ambulatory Visit: Payer: 59 | Admitting: Dermatology

## 2020-03-08 ENCOUNTER — Other Ambulatory Visit: Payer: Self-pay | Admitting: Dermatology

## 2020-03-08 ENCOUNTER — Other Ambulatory Visit: Payer: Self-pay

## 2020-03-08 DIAGNOSIS — L0100 Impetigo, unspecified: Secondary | ICD-10-CM | POA: Diagnosis not present

## 2020-03-08 MED ORDER — ALTABAX 1 % EX OINT: TOPICAL_OINTMENT | Freq: Two times a day (BID) | CUTANEOUS | 1 refills | Status: DC

## 2020-03-08 MED ORDER — CEFDINIR 300 MG PO CAPS: 300.0000 mg | ORAL_CAPSULE | Freq: Two times a day (BID) | ORAL | 0 refills | Status: DC

## 2020-03-08 MED ORDER — XEPI 1 % EX CREA: 1.0000 "application " | TOPICAL_CREAM | Freq: Two times a day (BID) | CUTANEOUS | 1 refills | Status: DC

## 2020-03-08 NOTE — Progress Notes (Signed)
   Follow-Up Visit   Subjective  Christine Petty is a 45 y.o. female who presents for the following: Impetigo (Upper lip, mupirocin bid, hx of Doxycycline bid 10 day course x 2, hx of cephalexion 2 courses, itching, pt feels like it flares about every week drainage yellow crust, hx of cold sores but this feels different).  She denies pain, and doesn't get blisters. It has been going on for months without completely clearing up.  Masking seems to make it worse.   The following portions of the chart were reviewed this encounter and updated as appropriate:       Review of Systems:  No other skin or systemic complaints except as noted in HPI or Assessment and Plan.  Objective  Well appearing patient in no apparent distress; mood and affect are within normal limits.  A focused examination was performed including face. Relevant physical exam findings are noted in the Assessment and Plan.  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  I, Othelia Pulling, RMA, am acting as scribe for Brendolyn Patty, MD . Documentation: I have reviewed the above documentation for accuracy and completeness, and I agree with the above.  Brendolyn Patty MD

## 2020-03-10 LAB — AEROBIC CULTURE

## 2020-03-15 ENCOUNTER — Telehealth: Payer: Self-pay

## 2020-03-15 NOTE — Telephone Encounter (Signed)
Informed pt of culture results.

## 2020-03-15 NOTE — Telephone Encounter (Signed)
Left pt message to call for culture results/sh

## 2020-03-15 NOTE — Telephone Encounter (Signed)
-----   Message from Brendolyn Patty, MD sent at 03/15/2020  9:44 AM EST ----- Bacterial culture is negative

## 2020-03-20 ENCOUNTER — Encounter: Payer: Self-pay | Admitting: Dermatology

## 2020-03-30 ENCOUNTER — Ambulatory Visit: Payer: 59 | Admitting: Dermatology

## 2020-03-30 ENCOUNTER — Encounter: Payer: Self-pay | Admitting: Dermatology

## 2020-03-30 ENCOUNTER — Other Ambulatory Visit: Payer: Self-pay

## 2020-03-30 DIAGNOSIS — D2271 Melanocytic nevi of right lower limb, including hip: Secondary | ICD-10-CM

## 2020-03-30 DIAGNOSIS — Z85828 Personal history of other malignant neoplasm of skin: Secondary | ICD-10-CM | POA: Diagnosis not present

## 2020-03-30 DIAGNOSIS — D225 Melanocytic nevi of trunk: Secondary | ICD-10-CM

## 2020-03-30 DIAGNOSIS — Z86018 Personal history of other benign neoplasm: Secondary | ICD-10-CM

## 2020-03-30 DIAGNOSIS — D2272 Melanocytic nevi of left lower limb, including hip: Secondary | ICD-10-CM

## 2020-03-30 DIAGNOSIS — L578 Other skin changes due to chronic exposure to nonionizing radiation: Secondary | ICD-10-CM

## 2020-03-30 DIAGNOSIS — Z1283 Encounter for screening for malignant neoplasm of skin: Secondary | ICD-10-CM | POA: Diagnosis not present

## 2020-03-30 DIAGNOSIS — Z85820 Personal history of malignant melanoma of skin: Secondary | ICD-10-CM

## 2020-03-30 DIAGNOSIS — L814 Other melanin hyperpigmentation: Secondary | ICD-10-CM

## 2020-03-30 DIAGNOSIS — D229 Melanocytic nevi, unspecified: Secondary | ICD-10-CM

## 2020-03-30 DIAGNOSIS — L0100 Impetigo, unspecified: Secondary | ICD-10-CM

## 2020-03-30 NOTE — Patient Instructions (Signed)

## 2020-03-30 NOTE — Progress Notes (Signed)
Follow-Up Visit   Subjective  Christine Petty is a 45 y.o. female who presents for the following: Follow-up (2 week follow-up impetigo of the upper lip. Bacterial culture was negative. Patient finished Omnicef, Altabax and area cleared, but started coming back 2 days ago. Area is a little pink and dry. She states this area looks better, but is not clear./She is also here for TBSE. She has a history of melanoma, BCCs, and multiple dysplastic nevi. She has not noticed any new or changing spots.).  The following portions of the chart were reviewed this encounter and updated as appropriate:       Review of Systems:  No other skin or systemic complaints except as noted in HPI or Assessment and Plan.  Objective  Well appearing patient in no apparent distress; mood and affect are within normal limits.  A full examination was performed including scalp, head, eyes, ears, nose, lips, neck, chest, axillae, abdomen, back, buttocks, bilateral upper extremities, bilateral lower extremities, hands, feet, fingers, toes, fingernails, and toenails. All findings within normal limits unless otherwise noted below.  Objective  Upper lip: Mild erythema. No crusting  Objective  Right Lateral Neck: Well healed scar with no evidence of recurrence  Objective  Spinal mid upper back: 5.41mm two-toned brown macule  Left Medial Knee: 6.11mm med brown papule  Right Posterior Thigh: 5.0 x 3.62mm brown macule with tiny dark brown macule at edge   Assessment & Plan   Skin cancer screening performed today.  Actinic Damage - chronic, secondary to cumulative UV radiation exposure/sun exposure over time - diffuse scaly erythematous macules with underlying dyspigmentation - Recommend daily broad spectrum sunscreen SPF 30+ to sun-exposed areas, reapply every 2 hours as needed.  - Call for new or changing lesions.  History of Basal Cell Carcinoma of the Skin - No evidence of recurrence today of the R lateral  forehead or R lateral canthus. - Recommend regular full body skin exams - Recommend daily broad spectrum sunscreen SPF 30+ to sun-exposed areas, reapply every 2 hours as needed.  - Call if any new or changing lesions are noted between office visits  History of Dysplastic Nevi - No evidence of recurrence today - Recommend regular full body skin exams - Recommend daily broad spectrum sunscreen SPF 30+ to sun-exposed areas, reapply every 2 hours as needed.  - Call if any new or changing lesions are noted between office visits  Melanocytic Nevi - Tan-brown and/or pink-flesh-colored symmetric macules and papules - Benign appearing on exam today - Observation - Call clinic for new or changing moles - Recommend daily use of broad spectrum spf 30+ sunscreen to sun-exposed areas.   Lentigines - Scattered tan macules - Discussed due to sun exposure - Benign, observe - Recommend daily broad spectrum sunscreen SPF 30+ to sun-exposed areas, reapply every 2 hours as needed. - Call for any changes   Impetigo Upper lip  Vrs dermatitis, chronic, Bacterial Culture negative. Improved but not at goal.  Start Eucrisa Ointment (sample given) Apply to AA upper lip BID. Alternate with Altabax Ointment bid until rash cleared  Other Related Medications retapamulin (ALTABAX) 1 % ointment  History of melanoma Right Lateral Neck  2010, 0.98 mm, Level IV, SLN neg. Clear. Observe for recurrence. Call clinic for new or changing moles.  Recommend regular skin exams, daily broad-spectrum spf 30+ sunscreen use, and photoprotection.     Nevus (3) Left Medial Knee; Right Posterior Thigh; Spinal mid upper back  Benign-appearing.  Observation.  Call clinic for new or changing moles.  Recommend daily use of broad spectrum spf 30+ sunscreen to sun-exposed areas.    Return in about 6 months (around 09/27/2020) for UBSE. Hx MM, BCC, DN.Lindi Adie, CMA, am acting as scribe for Brendolyn Patty, MD  .  Documentation: I have reviewed the above documentation for accuracy and completeness, and I agree with the above.  Brendolyn Patty MD

## 2020-04-07 ENCOUNTER — Encounter: Payer: 59 | Admitting: Dermatology

## 2020-05-07 ENCOUNTER — Encounter: Payer: Self-pay | Admitting: Dermatology

## 2020-05-09 ENCOUNTER — Other Ambulatory Visit: Payer: Self-pay

## 2020-05-09 ENCOUNTER — Ambulatory Visit (INDEPENDENT_AMBULATORY_CARE_PROVIDER_SITE_OTHER): Payer: Self-pay | Admitting: Plastic Surgery

## 2020-05-09 ENCOUNTER — Encounter: Payer: Self-pay | Admitting: Plastic Surgery

## 2020-05-09 VITALS — BP 118/76 | HR 77

## 2020-05-09 DIAGNOSIS — Z719 Counseling, unspecified: Secondary | ICD-10-CM

## 2020-05-09 MED ORDER — EUCRISA 2 % EX OINT: 1.0000 "application " | TOPICAL_OINTMENT | CUTANEOUS | 1 refills | Status: DC

## 2020-05-09 NOTE — Progress Notes (Signed)

## 2020-05-09 NOTE — Progress Notes (Signed)
Eucrisa ointment to [facility]./sh

## 2020-06-03 ENCOUNTER — Ambulatory Visit: Payer: 59 | Admitting: Family

## 2020-06-09 ENCOUNTER — Other Ambulatory Visit: Payer: Self-pay

## 2020-06-09 ENCOUNTER — Ambulatory Visit (INDEPENDENT_AMBULATORY_CARE_PROVIDER_SITE_OTHER): Payer: 59 | Admitting: Family

## 2020-06-09 ENCOUNTER — Encounter: Payer: Self-pay | Admitting: Family

## 2020-06-09 VITALS — BP 112/60 | HR 72 | Temp 97.8°F | Ht 63.0 in | Wt 163.2 lb

## 2020-06-09 DIAGNOSIS — Z0000 Encounter for general adult medical examination without abnormal findings: Secondary | ICD-10-CM

## 2020-06-09 DIAGNOSIS — Z1211 Encounter for screening for malignant neoplasm of colon: Secondary | ICD-10-CM

## 2020-06-09 NOTE — Progress Notes (Signed)
Subjective:    Patient ID: Christine Petty, female    DOB: 23-May-1975, 45 y.o.   MRN: [id number]  CC: Christine Petty is a 45 y.o. female who presents today for physical exam.    HPI: Feels well today No complaints    Colorectal Cancer Screening: due Breast Cancer Screening: Mammogram due Cervical Cancer Screening: UTD, last year negative HPV, normal cytology         Tetanus - utd  Hepatitis C screening - Candidate for, consents  Labs: Screening labs today. Exercise: Gets regular exercise.   Alcohol use:  rare Smoking/tobacco use: Nonsmoker.   Follows annually with dermatology for h/o BCC  HISTORY:  Past Medical History:  Diagnosis Date  . Basal cell carcinoma 03/10/2013   Right lateral forehead.   . Basal cell carcinoma 09/08/2013   Right lat. canthus. Superficial.  . Cancer ([place]) 2010   Skin Cancer  . Dysplastic nevi 09/07/2009   Left low back 7cm lat to spine, left low back sup. to waistline 6cm lat to spine, right mid popliteal; minimal atypia  . Dysplastic nevi 12/19/2009   Left inf. med scapula 6.0cm lat to spine, right mid to low back paraspinal 1.5cm lat to spine, right low back paraspinal above waistline; Moderate atypia.   Marland Kitchen Dysplastic nevi 04/22/2014   Right low scapula reshaved 05/25/2015 margins free, left low back paraspinal above sacral area. Mild atypia.  Marland Kitchen Dysplastic nevi 11/28/2015   Left medial cheek buttock midway, right mid posterior thigh closest to midline; mild atypia.   Marland Kitchen Dysplastic nevi 03/24/2018   Left epigastric, left lateral near side superior and parallel to umbilicus upper quadrant; moderate atypia.   Marland Kitchen Dysplastic nevus 08/17/2019   Right post. waistline. Moderate to marked atypia.   Marland Kitchen Dysplastic nevus 01/24/2009   RUQ abdomen. Moderate atypia  . Dysplastic nevus 01/24/2009   Right low back med. Slight to moderate atypia  . Dysplastic nevus 01/24/2009   Right low back lat. near side. Slight to moderate atypia  . Dysplastic nevus  01/24/2009   Mid to upper back spinal.   . Dysplastic nevus 04/25/2009   Left costal margin. Moderate to severe atypia. Excised: 06/01/2009. Residual DN, edges free.  Marland Kitchen Dysplastic nevus 04/25/2009   Right epigastric costal margin. Moderate to severe atypia. Excised: 05/25/2009. Margins free.  Marland Kitchen Dysplastic nevus 04/25/2009   Right mid to low back paraspinal. Moderate atypia.  Marland Kitchen Dysplastic nevus 07/18/2009   Left epigastric costal margin inf. Mild atypia.  Marland Kitchen Dysplastic nevus 07/18/2009   Right med. inf. scapula. Moderate to severe atypia. Excised: 08/31/2009, margins free.  Marland Kitchen Dysplastic nevus 07/18/2009   Right med sup. scapula. Moderate to severe atypia. Excised: 08/31/2009, margins free.  Marland Kitchen Dysplastic nevus 09/07/2009   Right lat. inf. deltoid. Mild atypia.  Marland Kitchen Dysplastic nevus 12/19/2009   Left mid to low back inf. to braline 9.5cm lat to spine. Moderate to severe atypia. Excised: 02/01/2010, margins free.  Marland Kitchen Dysplastic nevus 09/01/2012   Left distal bicep (near antecubital). Mild atypia.  Marland Kitchen Dysplastic nevus 03/10/2013   Right ant. lat. hip. Moderate atypia.   Marland Kitchen Dysplastic nevus 09/08/2013   Right inframammary sternum. Mild melanocytic atypia.   Marland Kitchen Dysplastic nevus 05/25/2015   Low back. Mild atypia  . Dysplastic nevus 05/28/2016   Left low back. Halo nevus with moderate atypia, margin involved.  Marland Kitchen Dysplastic nevus 01/30/2017   Right pubic area. Severe atypia. Re-shaved 04/11/2017. Persistent DN, margins free.  Marland Kitchen Dysplastic nevus 01/30/2017  Right mid side. Mild atypia.  Marland Kitchen Dysplastic nevus 08/22/2017   Left inf. lat. buttocks. Mild atypia, limited margins free.  Marland Kitchen Dysplastic nevus 09/08/2018   Left low back near side. Mild atypia, deep margin involved. Re-shaved: 04/09/2019, recurrent DN, margins free.  Marland Kitchen History of frequent urinary tract infections   . Melanoma ([place]) 08/16/2008   Right lateral neck. 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 Type: Nevoid. Neg sentinal node.    Past  Surgical History:  Procedure Laterality Date  . CESAREAN SECTION  2007  . EXCISION MELANOMA WITH SENTINEL LYMPH NODE BIOPSY  2010  . TONSILLECTOMY AND ADENOIDECTOMY  2009   Family History  Problem Relation Age of Onset  . Cancer Mother 81       Breast Cancer  . Hyperlipidemia Mother   . Hypertension Mother   . Breast cancer Mother 64  . Hyperlipidemia Father   . Hypertension Father   . Breast cancer Paternal Aunt 1  . Colon cancer Neg Hx       ALLERGIES: Patient has no known allergies.  Current Outpatient Medications on File Prior to Visit  Medication Sig Dispense Refill  . Crisaborole 2 % OINT APPLY 1 APPLICATION TOPICALLY ONCE A DAY TO 2 TIMES DAILY TO AFFECTED AREA(S) OF LIPS AS NEEDED FLARES 60 g 1  . retapamulin (ALTABAX) 1 % ointment Apply topically 2 (two) times daily. Bid to aa lips 30 g 1  . [DISCONTINUED] buPROPion (WELLBUTRIN XL) 150 MG 24 hr tablet Start 150 mg ER PO qam, increase after 3 days to 300 mg qam. 60 tablet 3   No current facility-administered medications on file prior to visit.    Social History   Tobacco Use  . Smoking status: Never Smoker  . Smokeless tobacco: Never Used  Vaping Use  . Vaping Use: Never used  Substance Use Topics  . Alcohol use: Yes    Alcohol/week: 0.0 standard drinks    Comment: Rare  . Drug use: No    Review of Systems    Objective:    BP 112/60   Pulse 72   Temp 97.8 F (36.6 C)   Ht 5\' 3"  (1.6 m)   Wt 163 lb 3.2 oz (74 kg)   SpO2 96%   BMI 28.91 kg/m   BP Readings from Last 3 Encounters:  06/09/20 112/60  05/09/20 118/76  12/27/19 122/60   Wt Readings from Last 3 Encounters:  06/09/20 163 lb 3.2 oz (74 kg)  12/27/19 160 lb (72.6 kg)  07/21/19 163 lb (73.9 kg)    Physical Exam     Assessment & Plan:   Problem List Items Addressed This Visit      Other   Annual physical exam - Primary    CBE performed. Deferred pelvic exam in the absence of complaints and Pap is UTD. Colonoscopy  referred.Counseled to continue regular exercise.       Relevant Orders   Ambulatory referral to Gastroenterology   CBC with Differential/Platelet (Completed)   Comprehensive metabolic panel (Completed)   FSH (Completed)   hCG, serum, qualitative (Completed)   Hemoglobin A1c (Completed)   Hepatitis C antibody (Completed)   Prolactin (Completed)   TSH (Completed)   VITAMIN D 25 Hydroxy (Vit-D Deficiency, Fractures) (Completed)    Other Visit Diagnoses    Encounter for screening colonoscopy           I have discontinued Nevah W. Steinbach's cefdinir and doxycycline. I am also having her maintain her Altabax and Crisaborole.  No orders of the defined types were placed in this encounter.   Return precautions given.   Risks, benefits, and alternatives of the medications and treatment plan prescribed today were discussed, and patient expressed understanding.   Education regarding symptom management and diagnosis given to patient on AVS.   Continue to follow with Burnard Hawthorne, FNP for routine health maintenance.   Christine Petty and I agreed with plan.   Mable Paris, FNP

## 2020-06-09 NOTE — Patient Instructions (Signed)
Always so nice to see you!  Referral for colonoscopy Let us know if you dont hear back within a week in regards to an appointment being scheduled.     Health Maintenance, Female Adopting a healthy lifestyle and getting preventive care are important in promoting health and wellness. Ask your health care provider about:  The right schedule for you to have regular tests and exams.  Things you can do on your own to prevent diseases and keep yourself healthy. What should I know about diet, weight, and exercise? Eat a healthy diet  Eat a diet that includes plenty of vegetables, fruits, low-fat dairy products, and lean protein.  Do not eat a lot of foods that are high in solid fats, added sugars, or sodium.   Maintain a healthy weight Body mass index (BMI) is used to identify weight problems. It estimates body fat based on height and weight. Your health care provider can help determine your BMI and help you achieve or maintain a healthy weight. Get regular exercise Get regular exercise. This is one of the most important things you can do for your health. Most adults should:  Exercise for at least 150 minutes each week. The exercise should increase your heart rate and make you sweat (moderate-intensity exercise).  Do strengthening exercises at least twice a week. This is in addition to the moderate-intensity exercise.  Spend less time sitting. Even light physical activity can be beneficial. Watch cholesterol and blood lipids Have your blood tested for lipids and cholesterol at 45 years of age, then have this test every 5 years. Have your cholesterol levels checked more often if:  Your lipid or cholesterol levels are high.  You are older than 45 years of age.  You are at high risk for heart disease. What should I know about cancer screening? Depending on your health history and family history, you may need to have cancer screening at various ages. This may include screening  for:  Breast cancer.  Cervical cancer.  Colorectal cancer.  Skin cancer.  Lung cancer. What should I know about heart disease, diabetes, and high blood pressure? Blood pressure and heart disease  High blood pressure causes heart disease and increases the risk of stroke. This is more likely to develop in people who have high blood pressure readings, are of African descent, or are overweight.  Have your blood pressure checked: ? Every 3-5 years if you are 84-57 years of age. ? Every year if you are 22 years old or older. Diabetes Have regular diabetes screenings. This checks your fasting blood sugar level. Have the screening done:  Once every three years after age 49 if you are at a normal weight and have a low risk for diabetes.  More often and at a younger age if you are overweight or have a high risk for diabetes. What should I know about preventing infection? Hepatitis B If you have a higher risk for hepatitis B, you should be screened for this virus. Talk with your health care provider to find out if you are at risk for hepatitis B infection. Hepatitis C Testing is recommended for:  Everyone born from [birth year] through 1965.  Anyone with known risk factors for hepatitis C. Sexually transmitted infections (STIs)  Get screened for STIs, including gonorrhea and chlamydia, if: ? You are sexually active and are younger than 44 years of age. ? You are older than 45 years of age and your health care provider tells you that you are at  risk for this type of infection. ? Your sexual activity has changed since you were last screened, and you are at increased risk for chlamydia or gonorrhea. Ask your health care provider if you are at risk.  Ask your health care provider about whether you are at high risk for HIV. Your health care provider may recommend a prescription medicine to help prevent HIV infection. If you choose to take medicine to prevent HIV, you should first get tested for HIV.  You should then be tested every 3 months for as long as you are taking the medicine. Pregnancy  If you are about to stop having your period (premenopausal) and you may become pregnant, seek counseling before you get pregnant.  Take 400 to 800 micrograms (mcg) of folic acid every day if you become pregnant.  Ask for birth control (contraception) if you want to prevent pregnancy. Osteoporosis and menopause Osteoporosis is a disease in which the bones lose minerals and strength with aging. This can result in bone fractures. If you are 60 years old or older, or if you are at risk for osteoporosis and fractures, ask your health care provider if you should:  Be screened for bone loss.  Take a calcium or vitamin D supplement to lower your risk of fractures.  Be given hormone replacement therapy (HRT) to treat symptoms of menopause. Follow these instructions at home: Lifestyle  Do not use any products that contain nicotine or tobacco, such as cigarettes, e-cigarettes, and chewing tobacco. If you need help quitting, ask your health care provider.  Do not use street drugs.  Do not share needles.  Ask your health care provider for help if you need support or information about quitting drugs. Alcohol use  Do not drink alcohol if: ? Your health care provider tells you not to drink. ? You are pregnant, may be pregnant, or are planning to become pregnant.  If you drink alcohol: ? Limit how much you use to 0-1 drink a day. ? Limit intake if you are breastfeeding.  Be aware of how much alcohol is in your drink. In the [place] one drink equals one 12 oz bottle of beer (355 mL), one 5 oz glass of wine (148 mL), or one 1 oz glass of hard liquor (44 mL). General instructions  Schedule regular health, dental, and eye exams.  Stay current with your vaccines.  Tell your health care provider if: ? You often feel depressed. ? You have ever been abused or do not feel safe at  home. Summary  Adopting a healthy lifestyle and getting preventive care are important in promoting health and wellness.  Follow your health care provider's instructions about healthy diet, exercising, and getting tested or screened for diseases.  Follow your health care provider's instructions on monitoring your cholesterol and blood pressure. This information is not intended to replace advice given to you by your health care provider. Make sure you discuss any questions you have with your health care provider. Document Revised: 02/05/2018 Document Reviewed: 02/05/2018 Elsevier Patient Education  2021 [employer].

## 2020-06-10 LAB — COMPREHENSIVE METABOLIC PANEL
ALT: 13 IU/L (ref 0–32)
AST: 17 IU/L (ref 0–40)
Albumin/Globulin Ratio: 1.3 (ref 1.2–2.2)
Albumin: 4.1 g/dL (ref 3.8–4.8)
Alkaline Phosphatase: 65 IU/L (ref 44–121)
BUN/Creatinine Ratio: 24 — ABNORMAL HIGH (ref 9–23)
BUN: 19 mg/dL (ref 6–24)
Bilirubin Total: 1.3 mg/dL — ABNORMAL HIGH (ref 0.0–1.2)
CO2: 21 mmol/L (ref 20–29)
Calcium: 9.3 mg/dL (ref 8.7–10.2)
Chloride: 102 mmol/L (ref 96–106)
Creatinine, Ser: 0.8 mg/dL (ref 0.57–1.00)
Globulin, Total: 3.1 g/dL (ref 1.5–4.5)
Glucose: 82 mg/dL (ref 65–99)
Potassium: 4.3 mmol/L (ref 3.5–5.2)
Sodium: 140 mmol/L (ref 134–144)
Total Protein: 7.2 g/dL (ref 6.0–8.5)
eGFR: 93 mL/min/{1.73_m2} (ref >59)

## 2020-06-10 LAB — CBC WITH DIFFERENTIAL/PLATELET
Basophils Absolute: 0 10*3/uL (ref 0.0–0.2)
Basos: 1 %
EOS (ABSOLUTE): 0.1 10*3/uL (ref 0.0–0.4)
Eos: 1 %
Hematocrit: 37.9 % (ref 34.0–46.6)
Hemoglobin: 13 g/dL (ref 11.1–15.9)
Immature Grans (Abs): 0 10*3/uL (ref 0.0–0.1)
Immature Granulocytes: 0 %
Lymphocytes Absolute: 2.4 10*3/uL (ref 0.7–3.1)
Lymphs: 35 %
MCH: 32.4 pg (ref 26.6–33.0)
MCHC: 34.3 g/dL (ref 31.5–35.7)
MCV: 95 fL (ref 79–97)
Monocytes Absolute: 0.5 10*3/uL (ref 0.1–0.9)
Monocytes: 7 %
Neutrophils Absolute: 3.9 10*3/uL (ref 1.4–7.0)
Neutrophils: 56 %
Platelets: 313 10*3/uL (ref 150–450)
RBC: 4.01 x10E6/uL (ref 3.77–5.28)
RDW: 11.9 % (ref 11.7–15.4)
WBC: 6.9 10*3/uL (ref 3.4–10.8)

## 2020-06-10 LAB — FOLLICLE STIMULATING HORMONE: FSH: 7.7 m[IU]/mL

## 2020-06-10 LAB — PROLACTIN: Prolactin: 13.8 ng/mL (ref 4.8–23.3)

## 2020-06-10 LAB — TSH: TSH: 1.08 u[IU]/mL (ref 0.450–4.500)

## 2020-06-10 LAB — HEMOGLOBIN A1C
Est. average glucose Bld gHb Est-mCnc: 100 mg/dL
Hgb A1c MFr Bld: 5.1 % (ref 4.8–5.6)

## 2020-06-10 LAB — VITAMIN D 25 HYDROXY (VIT D DEFICIENCY, FRACTURES): Vit D, 25-Hydroxy: 21.6 ng/mL — ABNORMAL LOW (ref 30.0–100.0)

## 2020-06-10 LAB — HCG, SERUM, QUALITATIVE: hCG,Beta Subunit,Qual,Serum: NEGATIVE m[IU]/mL (ref <6)

## 2020-06-10 LAB — HEPATITIS C ANTIBODY: Hep C Virus Ab: <0.1 s/co ratio (ref 0.0–0.9)

## 2020-06-13 ENCOUNTER — Ambulatory Visit: Payer: 59 | Admitting: Family

## 2020-06-14 ENCOUNTER — Other Ambulatory Visit: Payer: Self-pay | Admitting: Family

## 2020-06-15 NOTE — Assessment & Plan Note (Signed)
CBE performed. Deferred pelvic exam in the absence of complaints and Pap is UTD. Colonoscopy referred.Counseled to continue regular exercise.

## 2020-06-16 ENCOUNTER — Telehealth: Payer: Self-pay

## 2020-06-16 NOTE — Telephone Encounter (Signed)
LMTCB to scheduled lab work. Pt has viewed results through her mychart. All patient needs is lab appointment.

## 2020-06-20 ENCOUNTER — Encounter: Payer: Self-pay | Admitting: Family

## 2020-06-21 ENCOUNTER — Other Ambulatory Visit: Payer: Self-pay | Admitting: Family

## 2020-06-21 DIAGNOSIS — Z1231 Encounter for screening mammogram for malignant neoplasm of breast: Secondary | ICD-10-CM

## 2020-06-22 ENCOUNTER — Encounter: Payer: Self-pay | Admitting: Gastroenterology

## 2020-07-11 ENCOUNTER — Other Ambulatory Visit: Payer: 59

## 2020-07-12 ENCOUNTER — Other Ambulatory Visit (INDEPENDENT_AMBULATORY_CARE_PROVIDER_SITE_OTHER): Payer: 59

## 2020-07-12 ENCOUNTER — Other Ambulatory Visit: Payer: Self-pay

## 2020-07-13 ENCOUNTER — Ambulatory Visit
Admission: RE | Admit: 2020-07-13 | Discharge: 2020-07-13 | Disposition: A | Discharge status: Home / Self Care | Payer: 59 | Source: Ambulatory Visit | Attending: Family | Admitting: Family

## 2020-07-13 DIAGNOSIS — Z1231 Encounter for screening mammogram for malignant neoplasm of breast: Secondary | ICD-10-CM | POA: Insufficient documentation

## 2020-07-13 LAB — GAMMA GT: GGT: 7 U/L (ref 7–51)

## 2020-07-13 LAB — BILIRUBIN, FRACTIONATED(TOT/DIR/INDIR)
Bilirubin, Direct: 0.3 mg/dL — ABNORMAL HIGH (ref 0.0–0.2)
Indirect Bilirubin: 1.2 mg/dL (calc) (ref 0.2–1.2)
Total Bilirubin: 1.5 mg/dL — ABNORMAL HIGH (ref 0.2–1.2)

## 2020-07-15 ENCOUNTER — Other Ambulatory Visit: Payer: Self-pay | Admitting: Family

## 2020-07-21 ENCOUNTER — Other Ambulatory Visit: Payer: Self-pay | Admitting: Family

## 2020-07-26 ENCOUNTER — Telehealth: Payer: Self-pay | Admitting: Family

## 2020-07-26 NOTE — Telephone Encounter (Signed)
Patient was returning call about GI referral

## 2020-08-02 ENCOUNTER — Encounter: Payer: 59 | Admitting: Plastic Surgery

## 2020-08-02 DIAGNOSIS — H5213 Myopia, bilateral: Secondary | ICD-10-CM | POA: Diagnosis not present

## 2020-08-09 ENCOUNTER — Encounter: Payer: Self-pay | Admitting: Plastic Surgery

## 2020-08-09 ENCOUNTER — Ambulatory Visit (INDEPENDENT_AMBULATORY_CARE_PROVIDER_SITE_OTHER): Payer: Self-pay | Admitting: Plastic Surgery

## 2020-08-09 ENCOUNTER — Other Ambulatory Visit: Payer: Self-pay

## 2020-08-09 DIAGNOSIS — Z719 Counseling, unspecified: Secondary | ICD-10-CM

## 2020-08-09 NOTE — Progress Notes (Signed)

## 2020-08-19 ENCOUNTER — Ambulatory Visit (AMBULATORY_SURGERY_CENTER): Payer: 59

## 2020-08-19 ENCOUNTER — Other Ambulatory Visit: Payer: Self-pay

## 2020-08-19 VITALS — Ht 63.0 in | Wt 160.0 lb

## 2020-08-19 DIAGNOSIS — Z1211 Encounter for screening for malignant neoplasm of colon: Secondary | ICD-10-CM

## 2020-08-19 MED ORDER — NA SULFATE-K SULFATE-MG SULF 17.5-3.13-1.6 GM/177ML PO SOLN
1.0000 | Freq: Once | ORAL | 0 refills | Status: AC
  Filled 2020-08-19: qty 354, 1d supply, fill #0

## 2020-08-19 NOTE — Progress Notes (Signed)
Pre visit completed via phone call;  Patient verified name, DOB, and address;  No egg or soy allergy known to patient  No issues with past sedation with any surgeries or procedures Patient denies ever being told they had issues or difficulty with intubation  No FH of Malignant Hyperthermia No diet pills per patient No home 02 use per patient  No blood thinners per patient  Pt denies issues with constipation  No A fib or A flutter  EMMI video via MyChart  COVID 19 guidelines implemented in PV today with Pt and RN  Pt is fully vaccinated for Covid   NO PA's for preps discussed with pt in PV today  Discussed with pt there will be an out-of-pocket cost for prep and that varies from $0 to 70 dollars   Due to the COVID-19 pandemic we are asking patients to follow certain guidelines.  Pt aware of COVID protocols and [facility] guidelines

## 2020-08-22 ENCOUNTER — Other Ambulatory Visit: Payer: Self-pay

## 2020-08-31 ENCOUNTER — Encounter: Payer: Self-pay | Admitting: Gastroenterology

## 2020-09-02 ENCOUNTER — Encounter: Payer: Self-pay | Admitting: Gastroenterology

## 2020-09-02 ENCOUNTER — Ambulatory Visit (AMBULATORY_SURGERY_CENTER): Payer: 59 | Admitting: Gastroenterology

## 2020-09-02 ENCOUNTER — Other Ambulatory Visit: Payer: Self-pay

## 2020-09-02 VITALS — BP 100/61 | HR 62 | Temp 97.3°F | Resp 14 | Ht 63.0 in | Wt 160.0 lb

## 2020-09-02 DIAGNOSIS — E669 Obesity, unspecified: Secondary | ICD-10-CM | POA: Diagnosis not present

## 2020-09-02 DIAGNOSIS — Z1211 Encounter for screening for malignant neoplasm of colon: Secondary | ICD-10-CM

## 2020-09-02 MED ORDER — SODIUM CHLORIDE 0.9 % IV SOLN: 500.0000 mL | Freq: Once | INTRAVENOUS | Status: DC

## 2020-09-02 NOTE — Patient Instructions (Signed)
Impression/Recommendations:  Resume previous diet. Continue present medications. Repeat colonoscopy in 10 years for screening purposes.  YOU HAD AN ENDOSCOPIC PROCEDURE TODAY AT THE [facility]:   Refer to the procedure report that was given to you for any specific questions about what was found during the examination.  If the procedure report does not answer your questions, please call your gastroenterologist to clarify.  If you requested that your care partner not be given the details of your procedure findings, then the procedure report has been included in a sealed envelope for you to review at your convenience later.  YOU SHOULD EXPECT: Some feelings of bloating in the abdomen. Passage of more gas than usual.  Walking can help get rid of the air that was put into your GI tract during the procedure and reduce the bloating. If you had a lower endoscopy (such as a colonoscopy or flexible sigmoidoscopy) you may notice spotting of blood in your stool or on the toilet paper. If you underwent a bowel prep for your procedure, you may not have a normal bowel movement for a few days.  Please Note:  You might notice some irritation and congestion in your nose or some drainage.  This is from the oxygen used during your procedure.  There is no need for concern and it should clear up in a day or so.  SYMPTOMS TO REPORT IMMEDIATELY:  Following lower endoscopy (colonoscopy or flexible sigmoidoscopy):  Excessive amounts of blood in the stool  Significant tenderness or worsening of abdominal pains  Swelling of the abdomen that is new, acute  Fever of 100F or higher For urgent or emergent issues, a gastroenterologist can be reached at any hour by calling [phone]. Do not use MyChart messaging for urgent concerns.    DIET:  We do recommend a small meal at first, but then you may proceed to your regular diet.  Drink plenty of fluids but you should avoid alcoholic beverages for 24  hours.  ACTIVITY:  You should plan to take it easy for the rest of today and you should NOT DRIVE or use heavy machinery until tomorrow (because of the sedation medicines used during the test).    FOLLOW UP: Our staff will call the number listed on your records 48-72 hours following your procedure to check on you and address any questions or concerns that you may have regarding the information given to you following your procedure. If we do not reach you, we will leave a message.  We will attempt to reach you two times.  During this call, we will ask if you have developed any symptoms of COVID 19. If you develop any symptoms (ie: fever, flu-like symptoms, shortness of breath, cough etc.) before then, please call [phone].  If you test positive for Covid 19 in the 2 weeks post procedure, please call and report this information to us.    If any biopsies were taken you will be contacted by phone or by letter within the next 1-3 weeks.  Please call us at [phone] if you have not heard about the biopsies in 3 weeks.    SIGNATURES/CONFIDENTIALITY: You and/or your care partner have signed paperwork which will be entered into your electronic medical record.  These signatures attest to the fact that that the information above on your After Visit Summary has been reviewed and is understood.  Full responsibility of the confidentiality of this discharge information lies with you and/or your care-partner.  

## 2020-09-02 NOTE — Op Note (Signed)
[place] Patient Name: Christine Petty Procedure Date: 09/02/2020 8:23 AM MRN: [id number] Endoscopist: Mallie Mussel L. Loletha Carrow , MD Age: 45 Referring MD:  Date of Birth: 1975/03/03 Gender: Female Account #: [id number] Procedure:                Colonoscopy Indications:              Screening for colorectal malignant neoplasm, This                            is the patient's first colonoscopy Medicines:                Monitored Anesthesia Care Procedure:                Pre-Anesthesia Assessment:                           - Prior to the procedure, a History and Physical                            was performed, and patient medications and                            allergies were reviewed. The patient's tolerance of                            previous anesthesia was also reviewed. The risks                            and benefits of the procedure and the sedation                            options and risks were discussed with the patient.                            All questions were answered, and informed consent                            was obtained. Prior Anticoagulants: The patient has                            taken no previous anticoagulant or antiplatelet                            agents. ASA Grade Assessment: II - A patient with                            mild systemic disease. After reviewing the risks                            and benefits, the patient was deemed in                            satisfactory condition to undergo the procedure.  After obtaining informed consent, the colonoscope                            was passed under direct vision. Throughout the                            procedure, the patient's blood pressure, pulse, and                            oxygen saturations were monitored continuously. The                            CF [id number] #[id number] was introduced through the anus                            and advanced to the the  cecum, identified by                            appendiceal orifice and ileocecal valve. The                            colonoscopy was performed without difficulty. The                            patient tolerated the procedure well. The quality                            of the bowel preparation was excellent. The                            ileocecal valve, appendiceal orifice, and rectum                            were photographed. The bowel preparation used was                            Plenvu. Scope In: 8:34:52 AM Scope Out: 8:47:54 AM Scope Withdrawal Time: 0 hours 8 minutes 12 seconds  Total Procedure Duration: 0 hours 13 minutes 2 seconds  Findings:                 The perianal and digital rectal examinations were                            normal.                           The entire examined colon appeared normal on direct                            and retroflexion views. Complications:            No immediate complications. Estimated Blood Loss:     Estimated blood loss: none. Impression:               - The entire examined colon is normal on  direct and                            retroflexion views.                           - No specimens collected. Recommendation:           - Patient has a contact number available for                            emergencies. The signs and symptoms of potential                            delayed complications were discussed with the                            patient. Return to normal activities tomorrow.                            Written discharge instructions were provided to the                            patient.                           - Resume previous diet.                           - Continue present medications.                           - Repeat colonoscopy in 10 years for screening                            purposes. Atira Borello L. Loletha Carrow, MD 09/02/2020 8:52:24 AM This report has been signed electronically.

## 2020-09-02 NOTE — Progress Notes (Signed)
Pt's states no medical or surgical changes since previsit or office visit. 

## 2020-09-02 NOTE — Progress Notes (Signed)
Report to PACU, RN, vss, BBS= Clear.  

## 2020-09-06 ENCOUNTER — Telehealth: Payer: Self-pay

## 2020-09-06 NOTE — Telephone Encounter (Signed)
  Follow up Call-  Call back number 09/02/2020  Post procedure Call Back phone  # [phone]  Permission to leave phone message Yes  Some recent data might be hidden     Patient questions:  Do you have a fever, pain , or abdominal swelling? No. Pain Score  0 *  Have you tolerated food without any problems? Yes.    Have you been able to return to your normal activities? Yes.    Do you have any questions about your discharge instructions: Diet   No. Medications  No. Follow up visit  No.  Do you have questions or concerns about your Care? No.  Actions: * If pain score is 4 or above: No action needed, pain <4.

## 2020-09-29 ENCOUNTER — Ambulatory Visit: Payer: 59 | Admitting: Dermatology

## 2020-11-29 ENCOUNTER — Encounter: Payer: Self-pay | Admitting: Dermatology

## 2020-11-29 ENCOUNTER — Other Ambulatory Visit: Payer: Self-pay

## 2020-11-29 ENCOUNTER — Ambulatory Visit: Payer: 59 | Admitting: Dermatology

## 2020-11-29 DIAGNOSIS — Z85828 Personal history of other malignant neoplasm of skin: Secondary | ICD-10-CM

## 2020-11-29 DIAGNOSIS — L814 Other melanin hyperpigmentation: Secondary | ICD-10-CM

## 2020-11-29 DIAGNOSIS — L578 Other skin changes due to chronic exposure to nonionizing radiation: Secondary | ICD-10-CM | POA: Diagnosis not present

## 2020-11-29 DIAGNOSIS — Z86018 Personal history of other benign neoplasm: Secondary | ICD-10-CM

## 2020-11-29 DIAGNOSIS — D229 Melanocytic nevi, unspecified: Secondary | ICD-10-CM | POA: Diagnosis not present

## 2020-11-29 DIAGNOSIS — L570 Actinic keratosis: Secondary | ICD-10-CM

## 2020-11-29 DIAGNOSIS — Z85820 Personal history of malignant melanoma of skin: Secondary | ICD-10-CM | POA: Diagnosis not present

## 2020-11-29 DIAGNOSIS — Z1283 Encounter for screening for malignant neoplasm of skin: Secondary | ICD-10-CM

## 2020-11-29 DIAGNOSIS — L821 Other seborrheic keratosis: Secondary | ICD-10-CM

## 2020-11-29 DIAGNOSIS — D1800 Hemangioma unspecified site: Secondary | ICD-10-CM

## 2020-11-29 NOTE — Patient Instructions (Signed)

## 2020-11-29 NOTE — Progress Notes (Signed)
Follow-Up Visit   Subjective  Christine Petty is a 45 y.o. female who presents for the following: Annual Exam (Mole check ). Yearly mole check, hx of Melanoma, hx of BCC, Hx of Dysplastic nevus. Pt c/o new spot on the left forehead that need to be checked today.  The patient presents for Total-Body Skin Exam (TBSE) for skin cancer screening and mole check.   The following portions of the chart were reviewed this encounter and updated as appropriate:   Tobacco  Allergies  Meds  Problems  Med Hx  Surg Hx  Fam Hx     Review of Systems:  No other skin or systemic complaints except as noted in HPI or Assessment and Plan.  Objective  Well appearing patient in no apparent distress; mood and affect are within normal limits.  A full examination was performed including scalp, head, eyes, ears, nose, lips, neck, chest, axillae, abdomen, back, buttocks, bilateral upper extremities, bilateral lower extremities, hands, feet, fingers, toes, fingernails, and toenails. All findings within normal limits unless otherwise noted below.  left lateral forehead/left temple Erythematous thin papules/macules with gritty scale.         Assessment & Plan   Lentigines - Scattered tan macules - Due to sun exposure - Benign-appearing, observe - Recommend daily broad spectrum sunscreen SPF 30+ to sun-exposed areas, reapply every 2 hours as needed. - Call for any changes  Seborrheic Keratoses - Stuck-on, waxy, tan-brown papules and/or plaques  - Benign-appearing - Discussed benign etiology and prognosis. - Observe - Call for any changes  Melanocytic Nevi - Tan-brown and/or pink-flesh-colored symmetric macules and papules - Benign appearing on exam today - Observation - Call clinic for new or changing moles - Recommend daily use of broad spectrum spf 30+ sunscreen to sun-exposed areas.   Hemangiomas - Red papules - Discussed benign nature - Observe - Call for any changes  Actinic  Damage - Chronic condition, secondary to cumulative UV/sun exposure - diffuse scaly erythematous macules with underlying dyspigmentation - Recommend daily broad spectrum sunscreen SPF 30+ to sun-exposed areas, reapply every 2 hours as needed.  - Staying in the shade or wearing long sleeves, sun glasses (UVA+UVB protection) and wide brim hats (4-inch brim around the entire circumference of the hat) are also recommended for sun protection.  - Call for new or changing lesions.  History of Melanoma Right lateral neck 08/16/2008 - No evidence of recurrence today - No lymphadenopathy - Recommend regular full body skin exams - Recommend daily broad spectrum sunscreen SPF 30+ to sun-exposed areas, reapply every 2 hours as needed.  - Call if any new or changing lesions are noted between office visits   History of Basal Cell Carcinoma of the Skin Right lateral forehead, right lateral canthus  - No evidence of recurrence today - Recommend regular full body skin exams - Recommend daily broad spectrum sunscreen SPF 30+ to sun-exposed areas, reapply every 2 hours as needed.  - Call if any new or changing lesions are noted between office visits   History of Dysplastic Nevi Multiple see history  - No evidence of recurrence today - Recommend regular full body skin exams - Recommend daily broad spectrum sunscreen SPF 30+ to sun-exposed areas, reapply every 2 hours as needed.  - Call if any new or changing lesions are noted between office visits   Skin cancer screening performed today.   AK (actinic keratosis) left lateral forehead/left temple  Actinic keratoses are precancerous spots that appear secondary to cumulative  UV radiation exposure/sun exposure over time. They are chronic with expected duration over 1 year. A portion of actinic keratoses will progress to squamous cell carcinoma of the skin. It is not possible to reliably predict which spots will progress to skin cancer and so treatment is  recommended to prevent development of skin cancer.  Recommend daily broad spectrum sunscreen SPF 30+ to sun-exposed areas, reapply every 2 hours as needed.  Recommend staying in the shade or wearing long sleeves, sun glasses (UVA+UVB protection) and wide brim hats (4-inch brim around the entire circumference of the hat). Call for new or changing lesions.  Recheck at next office visit   Destruction of lesion - left lateral forehead/left temple  Destruction method: cryotherapy   Informed consent: discussed and consent obtained   Lesion destroyed using liquid nitrogen: Yes   Region frozen until ice ball extended beyond lesion: Yes   Outcome: patient tolerated procedure well with no complications   Post-procedure details: wound care instructions given    Skin cancer screening  Return in about 6 months (around 05/30/2021) for TBSE,, recheck Ak.  IMarye Round, CMA, am acting as scribe for Sarina Ser, MD .  Documentation: I have reviewed the above documentation for accuracy and completeness, and I agree with the above.  Sarina Ser, MD

## 2020-12-06 ENCOUNTER — Encounter: Payer: Self-pay | Admitting: Plastic Surgery

## 2020-12-06 ENCOUNTER — Ambulatory Visit (INDEPENDENT_AMBULATORY_CARE_PROVIDER_SITE_OTHER): Payer: Self-pay | Admitting: Plastic Surgery

## 2020-12-06 ENCOUNTER — Other Ambulatory Visit: Payer: Self-pay

## 2020-12-06 DIAGNOSIS — Z719 Counseling, unspecified: Secondary | ICD-10-CM

## 2020-12-06 NOTE — Progress Notes (Signed)
Botulinum Toxin Injection Procedure Note  Procedure: Cosmetic botulinum toxin   Pre-operative Diagnosis: Dynamic rhytides  Post-operative Diagnosis: Same  Complications:  None  Brief history: The patient desires botulinum toxin injection of her forehead. I discussed with the patient this proposed procedure of botulinum toxin injections, which is customized depending on the particular needs of the patient. It is performed on facial rhytids as a temporary correction. The alternatives were discussed with the patient. The risks were addressed including bleeding, scarring, infection, damage to deeper structures, asymmetry, and chronic pain, which may occur infrequently after a procedure. The individual's choice to undergo a surgical procedure is based on the comparison of risks to potential benefits. Other risks include unsatisfactory results, brow ptosis, eyelid ptosis, allergic reaction, temporary paralysis, which should go away with time, bruising, blurring disturbances and delayed healing. Botulinum toxin injections do not arrest the aging process or produce permanent tightening of the eyelid.  Operative intervention maybe necessary to maintain the results of a blepharoplasty or botulinum toxin. The patient understands and wishes to proceed.  Procedure: The area was prepped with alcohol and dried with a clean gauze. Using a clean technique, the botulinum toxin was diluted with 1.25 cc of preservative-free normal saline which was slowly injected with an 18 gauge needle in a tuberculin syringes.  A 32 gauge needles were then used to inject the botulinum toxin. This mixture allow for an aliquot of 4 units per 0.1 cc in each injection site.    Subsequently the mixture was injected in the glabellar and forehead area with preservation of the temporal branch to the lateral eyebrow as well as into each lateral canthal area beginning from the lateral orbital rim medial to the zygomaticus major in 3 separate  areas. A total of 26 Units of botulinum toxin was used. The forehead and glabellar area was injected with care to inject intramuscular only while holding pressure on the supratrochlear vessels in each area during each injection on either side of the medial corrugators. The injection proceeded vertically superiorly to the medial 2/3 of the frontalis muscle and superior 2/3 of the lateral frontalis, again with preservation of the frontal branch.  No complications were noted. Light pressure was held for 5 minutes. She was instructed explicitly in post-operative care.  Botox LOT:  [id number] C2 EXP:  7/23

## 2021-01-09 ENCOUNTER — Ambulatory Visit: Payer: 59 | Admitting: Dermatology

## 2021-01-25 ENCOUNTER — Ambulatory Visit: Payer: Self-pay

## 2021-03-07 DIAGNOSIS — M546 Pain in thoracic spine: Secondary | ICD-10-CM | POA: Diagnosis not present

## 2021-03-07 DIAGNOSIS — M9902 Segmental and somatic dysfunction of thoracic region: Secondary | ICD-10-CM | POA: Diagnosis not present

## 2021-03-07 DIAGNOSIS — M461 Sacroiliitis, not elsewhere classified: Secondary | ICD-10-CM | POA: Diagnosis not present

## 2021-03-07 DIAGNOSIS — M5137 Other intervertebral disc degeneration, lumbosacral region: Secondary | ICD-10-CM | POA: Diagnosis not present

## 2021-03-07 DIAGNOSIS — M9905 Segmental and somatic dysfunction of pelvic region: Secondary | ICD-10-CM | POA: Diagnosis not present

## 2021-03-07 DIAGNOSIS — M9903 Segmental and somatic dysfunction of lumbar region: Secondary | ICD-10-CM | POA: Diagnosis not present

## 2021-03-09 DIAGNOSIS — M461 Sacroiliitis, not elsewhere classified: Secondary | ICD-10-CM | POA: Diagnosis not present

## 2021-03-09 DIAGNOSIS — M9902 Segmental and somatic dysfunction of thoracic region: Secondary | ICD-10-CM | POA: Diagnosis not present

## 2021-03-09 DIAGNOSIS — M9905 Segmental and somatic dysfunction of pelvic region: Secondary | ICD-10-CM | POA: Diagnosis not present

## 2021-03-09 DIAGNOSIS — M9903 Segmental and somatic dysfunction of lumbar region: Secondary | ICD-10-CM | POA: Diagnosis not present

## 2021-03-09 DIAGNOSIS — M5137 Other intervertebral disc degeneration, lumbosacral region: Secondary | ICD-10-CM | POA: Diagnosis not present

## 2021-03-09 DIAGNOSIS — M546 Pain in thoracic spine: Secondary | ICD-10-CM | POA: Diagnosis not present

## 2021-03-13 DIAGNOSIS — M546 Pain in thoracic spine: Secondary | ICD-10-CM | POA: Diagnosis not present

## 2021-03-13 DIAGNOSIS — M9903 Segmental and somatic dysfunction of lumbar region: Secondary | ICD-10-CM | POA: Diagnosis not present

## 2021-03-13 DIAGNOSIS — M9902 Segmental and somatic dysfunction of thoracic region: Secondary | ICD-10-CM | POA: Diagnosis not present

## 2021-03-13 DIAGNOSIS — M5137 Other intervertebral disc degeneration, lumbosacral region: Secondary | ICD-10-CM | POA: Diagnosis not present

## 2021-03-13 DIAGNOSIS — M9905 Segmental and somatic dysfunction of pelvic region: Secondary | ICD-10-CM | POA: Diagnosis not present

## 2021-03-13 DIAGNOSIS — M461 Sacroiliitis, not elsewhere classified: Secondary | ICD-10-CM | POA: Diagnosis not present

## 2021-03-21 DIAGNOSIS — M546 Pain in thoracic spine: Secondary | ICD-10-CM | POA: Diagnosis not present

## 2021-03-21 DIAGNOSIS — M9905 Segmental and somatic dysfunction of pelvic region: Secondary | ICD-10-CM | POA: Diagnosis not present

## 2021-03-21 DIAGNOSIS — M5137 Other intervertebral disc degeneration, lumbosacral region: Secondary | ICD-10-CM | POA: Diagnosis not present

## 2021-03-21 DIAGNOSIS — M9903 Segmental and somatic dysfunction of lumbar region: Secondary | ICD-10-CM | POA: Diagnosis not present

## 2021-03-21 DIAGNOSIS — M461 Sacroiliitis, not elsewhere classified: Secondary | ICD-10-CM | POA: Diagnosis not present

## 2021-03-21 DIAGNOSIS — M9902 Segmental and somatic dysfunction of thoracic region: Secondary | ICD-10-CM | POA: Diagnosis not present

## 2021-03-29 DIAGNOSIS — M546 Pain in thoracic spine: Secondary | ICD-10-CM | POA: Diagnosis not present

## 2021-03-29 DIAGNOSIS — M461 Sacroiliitis, not elsewhere classified: Secondary | ICD-10-CM | POA: Diagnosis not present

## 2021-03-29 DIAGNOSIS — M9905 Segmental and somatic dysfunction of pelvic region: Secondary | ICD-10-CM | POA: Diagnosis not present

## 2021-03-29 DIAGNOSIS — M5137 Other intervertebral disc degeneration, lumbosacral region: Secondary | ICD-10-CM | POA: Diagnosis not present

## 2021-03-29 DIAGNOSIS — M9902 Segmental and somatic dysfunction of thoracic region: Secondary | ICD-10-CM | POA: Diagnosis not present

## 2021-03-29 DIAGNOSIS — M9903 Segmental and somatic dysfunction of lumbar region: Secondary | ICD-10-CM | POA: Diagnosis not present

## 2021-04-04 ENCOUNTER — Other Ambulatory Visit: Payer: Self-pay

## 2021-04-04 ENCOUNTER — Ambulatory Visit (INDEPENDENT_AMBULATORY_CARE_PROVIDER_SITE_OTHER): Payer: Self-pay | Admitting: Plastic Surgery

## 2021-04-04 ENCOUNTER — Encounter: Payer: Self-pay | Admitting: Plastic Surgery

## 2021-04-04 DIAGNOSIS — Z719 Counseling, unspecified: Secondary | ICD-10-CM

## 2021-04-04 NOTE — Progress Notes (Signed)
Botulinum Toxin and Filler Injection Procedure Note  Procedure: Cosmetic botulinum toxin   Pre-operative Diagnosis: Dynamic rhytides   Post-operative Diagnosis: Same  Complications:  None  Brief history: The patient desires botulinum toxin injection of her forehead. I discussed with the patient this proposed procedure of botulinum toxin injections, which is customized depending on the particular needs of the patient. It is performed on facial rhytids as a temporary correction. The alternatives were discussed with the patient. The risks were addressed including bleeding, scarring, infection, damage to deeper structures, asymmetry, and chronic pain, which may occur infrequently after a procedure. The individual's choice to undergo a surgical procedure is based on the comparison of risks to potential benefits. Other risks include unsatisfactory results, brow ptosis, eyelid ptosis, allergic reaction, temporary paralysis, which should go away with time, bruising, blurring disturbances and delayed healing. Botulinum toxin injections do not arrest the aging process or produce permanent tightening of the eyelid.  Operative intervention maybe necessary to maintain the results of a blepharoplasty or botulinum toxin. The patient understands and wishes to proceed.  Procedure: The area was prepped with alcohol and dried with a clean gauze. Using a clean technique, the botulinum toxin was diluted with 1.25 cc of preservative-free normal saline which was slowly injected with an 18 gauge needle in a tuberculin syringes.  A 32 gauge needles were then used to inject the botulinum toxin. This mixture allow for an aliquot of 4 units per 0.1 cc in each injection site.    Subsequently the mixture was injected in the glabellar and forehead area with preservation of the temporal branch to the lateral eyebrow as well as into each lateral canthal area beginning from the lateral orbital rim medial to the zygomaticus major in 3  separate areas. A total of 26 Units of botulinum toxin was used. The forehead and glabellar area was injected with care to inject intramuscular only while holding pressure on the supratrochlear vessels in each area during each injection on either side of the medial corrugators. The injection proceeded vertically superiorly to the medial 2/3 of the frontalis muscle and superior 2/3 of the lateral frontalis, again with preservation of the frontal branch.  No complications were noted. Light pressure was held for 5 minutes. She was instructed explicitly in post-operative care.  Botox LOT:  [id number] EXP:  25/3

## 2021-04-11 DIAGNOSIS — M546 Pain in thoracic spine: Secondary | ICD-10-CM | POA: Diagnosis not present

## 2021-04-11 DIAGNOSIS — M9902 Segmental and somatic dysfunction of thoracic region: Secondary | ICD-10-CM | POA: Diagnosis not present

## 2021-04-11 DIAGNOSIS — M9903 Segmental and somatic dysfunction of lumbar region: Secondary | ICD-10-CM | POA: Diagnosis not present

## 2021-04-11 DIAGNOSIS — M9905 Segmental and somatic dysfunction of pelvic region: Secondary | ICD-10-CM | POA: Diagnosis not present

## 2021-04-11 DIAGNOSIS — M5137 Other intervertebral disc degeneration, lumbosacral region: Secondary | ICD-10-CM | POA: Diagnosis not present

## 2021-04-11 DIAGNOSIS — M461 Sacroiliitis, not elsewhere classified: Secondary | ICD-10-CM | POA: Diagnosis not present

## 2021-04-26 DIAGNOSIS — M9902 Segmental and somatic dysfunction of thoracic region: Secondary | ICD-10-CM | POA: Diagnosis not present

## 2021-04-26 DIAGNOSIS — M9903 Segmental and somatic dysfunction of lumbar region: Secondary | ICD-10-CM | POA: Diagnosis not present

## 2021-04-26 DIAGNOSIS — M546 Pain in thoracic spine: Secondary | ICD-10-CM | POA: Diagnosis not present

## 2021-04-26 DIAGNOSIS — M461 Sacroiliitis, not elsewhere classified: Secondary | ICD-10-CM | POA: Diagnosis not present

## 2021-04-26 DIAGNOSIS — M5137 Other intervertebral disc degeneration, lumbosacral region: Secondary | ICD-10-CM | POA: Diagnosis not present

## 2021-04-26 DIAGNOSIS — M9905 Segmental and somatic dysfunction of pelvic region: Secondary | ICD-10-CM | POA: Diagnosis not present

## 2021-05-17 DIAGNOSIS — M546 Pain in thoracic spine: Secondary | ICD-10-CM | POA: Diagnosis not present

## 2021-05-17 DIAGNOSIS — M9902 Segmental and somatic dysfunction of thoracic region: Secondary | ICD-10-CM | POA: Diagnosis not present

## 2021-05-17 DIAGNOSIS — M9903 Segmental and somatic dysfunction of lumbar region: Secondary | ICD-10-CM | POA: Diagnosis not present

## 2021-05-17 DIAGNOSIS — M461 Sacroiliitis, not elsewhere classified: Secondary | ICD-10-CM | POA: Diagnosis not present

## 2021-05-17 DIAGNOSIS — M5137 Other intervertebral disc degeneration, lumbosacral region: Secondary | ICD-10-CM | POA: Diagnosis not present

## 2021-05-17 DIAGNOSIS — M9905 Segmental and somatic dysfunction of pelvic region: Secondary | ICD-10-CM | POA: Diagnosis not present

## 2021-05-26 ENCOUNTER — Other Ambulatory Visit: Payer: Self-pay

## 2021-05-26 MED ORDER — CARESTART COVID-19 HOME TEST VI KIT
PACK | 0 refills | Status: DC
  Filled 2021-05-26: qty 2, 4d supply, fill #0

## 2021-05-31 ENCOUNTER — Ambulatory Visit: Payer: 59 | Admitting: Dermatology

## 2021-05-31 DIAGNOSIS — L814 Other melanin hyperpigmentation: Secondary | ICD-10-CM

## 2021-05-31 DIAGNOSIS — D1800 Hemangioma unspecified site: Secondary | ICD-10-CM

## 2021-05-31 DIAGNOSIS — Z1283 Encounter for screening for malignant neoplasm of skin: Secondary | ICD-10-CM | POA: Diagnosis not present

## 2021-05-31 DIAGNOSIS — L821 Other seborrheic keratosis: Secondary | ICD-10-CM

## 2021-05-31 DIAGNOSIS — Z85820 Personal history of malignant melanoma of skin: Secondary | ICD-10-CM

## 2021-05-31 DIAGNOSIS — Z872 Personal history of diseases of the skin and subcutaneous tissue: Secondary | ICD-10-CM | POA: Diagnosis not present

## 2021-05-31 DIAGNOSIS — D229 Melanocytic nevi, unspecified: Secondary | ICD-10-CM | POA: Diagnosis not present

## 2021-05-31 DIAGNOSIS — L578 Other skin changes due to chronic exposure to nonionizing radiation: Secondary | ICD-10-CM

## 2021-05-31 DIAGNOSIS — Z85828 Personal history of other malignant neoplasm of skin: Secondary | ICD-10-CM

## 2021-05-31 NOTE — Patient Instructions (Signed)

## 2021-05-31 NOTE — Progress Notes (Signed)
? ?Follow-Up Visit ?  ?Subjective  ?Christine Petty is a 46 y.o. female who presents for the following: Annual Exam (Hx MM, BCC, dysplastic nevi, and AK's ). The patient presents for an upper body skin exam for skin cancer screening and mole check.  The patient has spots, moles and lesions to be evaluated, some may be new or changing. ?The following portions of the chart were reviewed this encounter and updated as appropriate:  ? Tobacco  Allergies  Meds  Problems  Med Hx  Surg Hx  Fam Hx   ?  ?Review of Systems:  No other skin or systemic complaints except as noted in HPI or Assessment and Plan. ? ?Objective  ?Well appearing patient in no apparent distress; mood and affect are within normal limits. ? ?A full examination was performed including scalp, head, eyes, ears, nose, lips, neck, chest, axillae, abdomen, back, buttocks, bilateral upper extremities, bilateral lower extremities, hands, feet, fingers, toes, fingernails, and toenails. All findings within normal limits unless otherwise noted below. ? ?L lat forehead/L temple ?Clear. ? ? ?Assessment & Plan  ?History of actinic keratosis ?L lat forehead/L temple ? ?Clear. Observe for recurrence. Call clinic for new or changing lesions.  Recommend regular skin exams, daily broad-spectrum spf 30+ sunscreen use, and photoprotection.   ? ? ?Lentigines ?- Scattered tan macules ?- Due to sun exposure ?- Benign-appearing, observe ?- Recommend daily broad spectrum sunscreen SPF 30+ to sun-exposed areas, reapply every 2 hours as needed. ?- Call for any changes ? ?Seborrheic Keratoses ?- Stuck-on, waxy, tan-brown papules and/or plaques  ?- Benign-appearing ?- Discussed benign etiology and prognosis. ?- Observe ?- Call for any changes ? ?Melanocytic Nevi ?- Tan-brown and/or pink-flesh-colored symmetric macules and papules ?- Benign appearing on exam today ?- Observation ?- Call clinic for new or changing moles ?- Recommend daily use of broad spectrum spf 30+ sunscreen to  sun-exposed areas.  ? ?Hemangiomas ?- Red papules ?- Discussed benign nature ?- Observe ?- Call for any changes ? ?Actinic Damage ?- Chronic condition, secondary to cumulative UV/sun exposure ?- diffuse scaly erythematous macules with underlying dyspigmentation ?- Recommend daily broad spectrum sunscreen SPF 30+ to sun-exposed areas, reapply every 2 hours as needed.  ?- Staying in the shade or wearing long sleeves, sun glasses (UVA+UVB protection) and wide brim hats (4-inch brim around the entire circumference of the hat) are also recommended for sun protection.  ?- Call for new or changing lesions. ? ?History of Basal Cell Carcinoma of the Skin ?- No evidence of recurrence today ?- Recommend regular full body skin exams ?- Recommend daily broad spectrum sunscreen SPF 30+ to sun-exposed areas, reapply every 2 hours as needed.  ?- Call if any new or changing lesions are noted between office visits ? ?History of Dysplastic Nevi ?- No evidence of recurrence today ?- Recommend regular full body skin exams ?- Recommend daily broad spectrum sunscreen SPF 30+ to sun-exposed areas, reapply every 2 hours as needed.  ?- Call if any new or changing lesions are noted between office visits ? ?History of Melanoma - R lat neck WLE 2010, neg sentinel node ?- No evidence of recurrence today ?- No lymphadenopathy ?- Recommend regular full body skin exams ?- Recommend daily broad spectrum sunscreen SPF 30+ to sun-exposed areas, reapply every 2 hours as needed.  ?- Call if any new or changing lesions are noted between office visits ? ?Skin cancer screening performed today. ? ?Return in about 6 months (around 11/30/2021) for TBSE. ? ?  Luther Redo, CMA, am acting as scribe for Sarina Ser, MD . ?Documentation: I have reviewed the above documentation for accuracy and completeness, and I agree with the above. ? ?Sarina Ser, MD ? ?

## 2021-06-01 ENCOUNTER — Encounter: Payer: Self-pay | Admitting: Dermatology

## 2021-06-07 DIAGNOSIS — M9903 Segmental and somatic dysfunction of lumbar region: Secondary | ICD-10-CM | POA: Diagnosis not present

## 2021-06-07 DIAGNOSIS — M5137 Other intervertebral disc degeneration, lumbosacral region: Secondary | ICD-10-CM | POA: Diagnosis not present

## 2021-06-07 DIAGNOSIS — M461 Sacroiliitis, not elsewhere classified: Secondary | ICD-10-CM | POA: Diagnosis not present

## 2021-06-07 DIAGNOSIS — M546 Pain in thoracic spine: Secondary | ICD-10-CM | POA: Diagnosis not present

## 2021-06-07 DIAGNOSIS — M9902 Segmental and somatic dysfunction of thoracic region: Secondary | ICD-10-CM | POA: Diagnosis not present

## 2021-06-07 DIAGNOSIS — M9905 Segmental and somatic dysfunction of pelvic region: Secondary | ICD-10-CM | POA: Diagnosis not present

## 2021-07-05 DIAGNOSIS — M461 Sacroiliitis, not elsewhere classified: Secondary | ICD-10-CM | POA: Diagnosis not present

## 2021-07-05 DIAGNOSIS — M5137 Other intervertebral disc degeneration, lumbosacral region: Secondary | ICD-10-CM | POA: Diagnosis not present

## 2021-07-05 DIAGNOSIS — M9905 Segmental and somatic dysfunction of pelvic region: Secondary | ICD-10-CM | POA: Diagnosis not present

## 2021-07-05 DIAGNOSIS — M9902 Segmental and somatic dysfunction of thoracic region: Secondary | ICD-10-CM | POA: Diagnosis not present

## 2021-07-05 DIAGNOSIS — M546 Pain in thoracic spine: Secondary | ICD-10-CM | POA: Diagnosis not present

## 2021-07-05 DIAGNOSIS — M9903 Segmental and somatic dysfunction of lumbar region: Secondary | ICD-10-CM | POA: Diagnosis not present

## 2021-07-12 ENCOUNTER — Ambulatory Visit (INDEPENDENT_AMBULATORY_CARE_PROVIDER_SITE_OTHER): Payer: 59 | Admitting: Family

## 2021-07-12 ENCOUNTER — Encounter: Payer: Self-pay | Admitting: Family

## 2021-07-12 VITALS — BP 120/76 | HR 67 | Temp 98.2°F | Ht 63.0 in | Wt 174.4 lb

## 2021-07-12 DIAGNOSIS — Z0000 Encounter for general adult medical examination without abnormal findings: Secondary | ICD-10-CM

## 2021-07-12 DIAGNOSIS — Z23 Encounter for immunization: Secondary | ICD-10-CM | POA: Diagnosis not present

## 2021-07-12 DIAGNOSIS — Z1231 Encounter for screening mammogram for malignant neoplasm of breast: Secondary | ICD-10-CM

## 2021-07-12 NOTE — Progress Notes (Signed)
? ?Subjective:  ? ? Patient ID: Christine Petty, female    DOB: Jul 06, 1975, 46 y.o.   MRN: [id number] ? ?CC: Christine Petty is a 46 y.o. female who presents today for physical exam.   ? ?HPI: Feels well today.  No new complaints ? ? ?Colorectal Cancer Screening: UTD , 09/02/20 ?Breast Cancer Screening: Mammogram due in a couple of days ?Cervical Cancer Screening: UTD, 04/01/19 ? ?      Tetanus - due ?   ?Labs: Screening labs today. ?Exercise: Gets regular exercise.   ?Alcohol use:  rare ?Smoking/tobacco use: Nonsmoker.   ?H/o BCC and melanoma; she continues to follow with dermatology, Dr. Bea Laura ? ?HISTORY:  ?Past Medical History:  ?Diagnosis Date  ? Basal cell carcinoma 03/10/2013  ? Right lateral forehead.   ? Basal cell carcinoma 09/08/2013  ? Right lat. canthus. Superficial.  ? Cancer ([place]) 2010  ? Skin Cancer  ? Dysplastic nevi 09/07/2009  ? Left low back 7cm lat to spine, left low back sup. to waistline 6cm lat to spine, right mid popliteal; minimal atypia  ? Dysplastic nevi 12/19/2009  ? Left inf. med scapula 6.0cm lat to spine, right mid to low back paraspinal 1.5cm lat to spine, right low back paraspinal above waistline; Moderate atypia.   ? Dysplastic nevi 04/22/2014  ? Right low scapula reshaved 05/25/2015 margins free, left low back paraspinal above sacral area. Mild atypia.  ? Dysplastic nevi 11/28/2015  ? Left medial cheek buttock midway, right mid posterior thigh closest to midline; mild atypia.   ? Dysplastic nevi 03/24/2018  ? Left epigastric, left lateral near side superior and parallel to umbilicus upper quadrant; moderate atypia.   ? Dysplastic nevus 08/17/2019  ? Right post. waistline. Moderate to marked atypia.   ? Dysplastic nevus 01/24/2009  ? RUQ abdomen. Moderate atypia  ? Dysplastic nevus 01/24/2009  ? Right low back med. Slight to moderate atypia  ? Dysplastic nevus 01/24/2009  ? Right low back lat. near side. Slight to moderate atypia  ? Dysplastic nevus 01/24/2009  ? Mid to upper back spinal.    ? Dysplastic nevus 04/25/2009  ? Left costal margin. Moderate to severe atypia. Excised: 06/01/2009. Residual DN, edges free.  ? Dysplastic nevus 04/25/2009  ? Right epigastric costal margin. Moderate to severe atypia. Excised: 05/25/2009. Margins free.  ? Dysplastic nevus 04/25/2009  ? Right mid to low back paraspinal. Moderate atypia.  ? Dysplastic nevus 07/18/2009  ? Left epigastric costal margin inf. Mild atypia.  ? Dysplastic nevus 07/18/2009  ? Right med. inf. scapula. Moderate to severe atypia. Excised: 08/31/2009, margins free.  ? Dysplastic nevus 07/18/2009  ? Right med sup. scapula. Moderate to severe atypia. Excised: 08/31/2009, margins free.  ? Dysplastic nevus 09/07/2009  ? Right lat. inf. deltoid. Mild atypia.  ? Dysplastic nevus 12/19/2009  ? Left mid to low back inf. to braline 9.5cm lat to spine. Moderate to severe atypia. Excised: 02/01/2010, margins free.  ? Dysplastic nevus 09/01/2012  ? Left distal bicep (near antecubital). Mild atypia.  ? Dysplastic nevus 03/10/2013  ? Right ant. lat. hip. Moderate atypia.   ? Dysplastic nevus 09/08/2013  ? Right inframammary sternum. Mild melanocytic atypia.   ? Dysplastic nevus 05/25/2015  ? Low back. Mild atypia  ? Dysplastic nevus 05/28/2016  ? Left low back. Halo nevus with moderate atypia, margin involved.  ? Dysplastic nevus 01/30/2017  ? Right pubic area. Severe atypia. Re-shaved 04/11/2017. Persistent DN, margins free.  ? Dysplastic nevus 01/30/2017  ?  Right mid side. Mild atypia.  ? Dysplastic nevus 08/22/2017  ? Left inf. lat. buttocks. Mild atypia, limited margins free.  ? Dysplastic nevus 09/08/2018  ? Left low back near side. Mild atypia, deep margin involved. Re-shaved: 04/09/2019, recurrent DN, margins free.  ? History of frequent urinary tract infections   ? Melanoma ([place]) 08/16/2008  ? Right lateral neck. 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 Type: Nevoid. Neg sentinal node.  ?  ?Past Surgical History:  ?Procedure Laterality Date  ? CESAREAN SECTION   2007  ? EXCISION MELANOMA WITH SENTINEL LYMPH NODE BIOPSY Right 2010  ? side of neck  ? TONSILLECTOMY AND ADENOIDECTOMY  2009  ? [place] EXTRACTION  1997  ? ?Family History  ?Problem Relation Age of Onset  ? Colon polyps Mother 25  ? Cancer Mother 28  ?     Breast Cancer  ? Hyperlipidemia Mother   ? Hypertension Mother   ? Breast cancer Mother 16  ? Hyperlipidemia Father   ? Hypertension Father   ? Breast cancer Paternal Aunt 64  ? Colon cancer Neg Hx   ? Esophageal cancer Neg Hx   ? Stomach cancer Neg Hx   ? Rectal cancer Neg Hx   ? ?  ? ?ALLERGIES: Patient has no known allergies. ? ?Current Outpatient Medications on File Prior to Visit  ?Medication Sig Dispense Refill  ? COVID-19 At Home Antigen Test [facility] COVID-19 HOME TEST) KIT Use as directed (Patient not taking: Reported on 05/31/2021) 2 kit 0  ? [DISCONTINUED] buPROPion (WELLBUTRIN XL) 150 MG 24 hr tablet Start 150 mg ER PO qam, increase after 3 days to 300 mg qam. 60 tablet 3  ? ?No current facility-administered medications on file prior to visit.  ? ? ?Social History  ? ?Tobacco Use  ? Smoking status: Never  ? Smokeless tobacco: Never  ?Vaping Use  ? Vaping Use: Never used  ?Substance Use Topics  ? Alcohol use: Yes  ?  Alcohol/week: 0.0 standard drinks  ?  Comment: Rare  ? Drug use: No  ? ? ?Review of Systems  ?Constitutional:  Negative for chills, fever and unexpected weight change.  ?HENT:  Negative for congestion.   ?Respiratory:  Negative for cough.   ?Cardiovascular:  Negative for chest pain, palpitations and leg swelling.  ?Gastrointestinal:  Negative for nausea and vomiting.  ?Genitourinary:  Negative for pelvic pain.  ?Musculoskeletal:  Negative for arthralgias and myalgias.  ?Skin:  Negative for rash.  ?Neurological:  Negative for headaches.  ?Hematological:  Negative for adenopathy.  ?Psychiatric/Behavioral:  Negative for confusion.   ?   ?Objective:  ?  ?BP 120/76 (BP Location: Left Arm, Patient Position: Sitting, Cuff Size: Normal)    Pulse 67   Temp 98.2 ?F (36.8 ?C) (Oral)   Ht $R'5\' 3"'xX$  (1.6 m)   Wt 174 lb 6.4 oz (79.1 kg)   LMP  (LMP Unknown)   SpO2 98%   BMI 30.89 kg/m?  ? ?BP Readings from Last 3 Encounters:  ?07/12/21 120/76  ?09/02/20 100/61  ?06/09/20 112/60  ? ?Wt Readings from Last 3 Encounters:  ?07/12/21 174 lb 6.4 oz (79.1 kg)  ?09/02/20 160 lb (72.6 kg)  ?08/19/20 160 lb (72.6 kg)  ? ? ?Physical Exam ?Vitals reviewed.  ?Constitutional:   ?   Appearance: Normal appearance. She is well-developed.  ?Eyes:  ?   Conjunctiva/sclera: Conjunctivae normal.  ?Neck:  ?   Thyroid: No thyroid mass or thyromegaly.  ?Cardiovascular:  ?   Rate  and Rhythm: Normal rate and regular rhythm.  ?   Pulses: Normal pulses.  ?   Heart sounds: Normal heart sounds.  ?Pulmonary:  ?   Effort: Pulmonary effort is normal.  ?   Breath sounds: Normal breath sounds. No wheezing, rhonchi or rales.  ?Chest:  ?Breasts: ?   Breasts are symmetrical.  ?   Right: No inverted nipple, mass, nipple discharge, skin change or tenderness.  ?   Left: No inverted nipple, mass, nipple discharge, skin change or tenderness.  ?Abdominal:  ?   General: Bowel sounds are normal. There is no distension.  ?   Palpations: Abdomen is soft. Abdomen is not rigid. There is no fluid wave or mass.  ?   Tenderness: There is no abdominal tenderness. There is no guarding or rebound.  ?Lymphadenopathy:  ?   Head:  ?   Right side of head: No submental, submandibular, tonsillar, preauricular, posterior auricular or occipital adenopathy.  ?   Left side of head: No submental, submandibular, tonsillar, preauricular, posterior auricular or occipital adenopathy.  ?   Cervical: No cervical adenopathy.  ?   Right cervical: No superficial, deep or posterior cervical adenopathy. ?   Left cervical: No superficial, deep or posterior cervical adenopathy.  ?Skin: ?   General: Skin is warm and dry.  ?Neurological:  ?   Mental Status: She is alert.  ?Psychiatric:     ?   Speech: Speech normal.     ?   Behavior:  Behavior normal.     ?   Thought Content: Thought content normal.  ? ? ?   ?Assessment & Plan:  ? ?Problem List Items Addressed This Visit   ? ?  ? Other  ? Annual physical exam - Primary  ?  Clinical breast e

## 2021-07-12 NOTE — Patient Instructions (Addendum)
Please call  and schedule your 3D mammogram and /or bone density scan as we discussed. ? ? [facility]  ( new location in 2023) ? ?[address], [place], [place] [address] ? ?[place], [place]  ?[phone] ? ?Nice to see you! ? ? ?Health Maintenance, Female ?Adopting a healthy lifestyle and getting preventive care are important in promoting health and wellness. Ask your health care provider about: ?The right schedule for you to have regular tests and exams. ?Things you can do on your own to prevent diseases and keep yourself healthy. ?What should I know about diet, weight, and exercise? ?Eat a healthy diet ? ?Eat a diet that includes plenty of vegetables, fruits, low-fat dairy products, and lean protein. ?Do not eat a lot of foods that are high in solid fats, added sugars, or sodium. ?Maintain a healthy weight ?Body mass index (BMI) is used to identify weight problems. It estimates body fat based on height and weight. Your health care provider can help determine your BMI and help you achieve or maintain a healthy weight. ?Get regular exercise ?Get regular exercise. This is one of the most important things you can do for your health. Most adults should: ?Exercise for at least 150 minutes each week. The exercise should increase your heart rate and make you sweat (moderate-intensity exercise). ?Do strengthening exercises at least twice a week. This is in addition to the moderate-intensity exercise. ?Spend less time sitting. Even light physical activity can be beneficial. ?Watch cholesterol and blood lipids ?Have your blood tested for lipids and cholesterol at 46 years of age, then have this test every 5 years. ?Have your cholesterol levels checked more often if: ?Your lipid or cholesterol levels are high. ?You are older than 46 years of age. ?You are at high risk for heart disease. ?What should I know about cancer screening? ?Depending on your health history and family history, you may need to  have cancer screening at various ages. This may include screening for: ?Breast cancer. ?Cervical cancer. ?Colorectal cancer. ?Skin cancer. ?Lung cancer. ?What should I know about heart disease, diabetes, and high blood pressure? ?Blood pressure and heart disease ?High blood pressure causes heart disease and increases the risk of stroke. This is more likely to develop in people who have high blood pressure readings or are overweight. ?Have your blood pressure checked: ?Every 3-5 years if you are 52-51 years of age. ?Every year if you are 3 years old or older. ?Diabetes ?Have regular diabetes screenings. This checks your fasting blood sugar level. Have the screening done: ?Once every three years after age 74 if you are at a normal weight and have a low risk for diabetes. ?More often and at a younger age if you are overweight or have a high risk for diabetes. ?What should I know about preventing infection? ?Hepatitis B ?If you have a higher risk for hepatitis B, you should be screened for this virus. Talk with your health care provider to find out if you are at risk for hepatitis B infection. ?Hepatitis C ?Testing is recommended for: ?Everyone born from [birth year] through 1965. ?Anyone with known risk factors for hepatitis C. ?Sexually transmitted infections (STIs) ?Get screened for STIs, including gonorrhea and chlamydia, if: ?You are sexually active and are younger than 46 years of age. ?You are older than 46 years of age and your health care provider tells you that you are at risk for this type of infection. ?Your sexual activity has changed since you were  last screened, and you are at increased risk for chlamydia or gonorrhea. Ask your health care provider if you are at risk. ?Ask your health care provider about whether you are at high risk for HIV. Your health care provider may recommend a prescription medicine to help prevent HIV infection. If you choose to take medicine to prevent HIV, you should first get tested for  HIV. You should then be tested every 3 months for as long as you are taking the medicine. ?Pregnancy ?If you are about to stop having your period (premenopausal) and you may become pregnant, seek counseling before you get pregnant. ?Take 400 to 800 micrograms (mcg) of folic acid every day if you become pregnant. ?Ask for birth control (contraception) if you want to prevent pregnancy. ?Osteoporosis and menopause ?Osteoporosis is a disease in which the bones lose minerals and strength with aging. This can result in bone fractures. If you are 66 years old or older, or if you are at risk for osteoporosis and fractures, ask your health care provider if you should: ?Be screened for bone loss. ?Take a calcium or vitamin D supplement to lower your risk of fractures. ?Be given hormone replacement therapy (HRT) to treat symptoms of menopause. ?Follow these instructions at home: ?Alcohol use ?Do not drink alcohol if: ?Your health care provider tells you not to drink. ?You are pregnant, may be pregnant, or are planning to become pregnant. ?If you drink alcohol: ?Limit how much you have to: ?0-1 drink a day. ?Know how much alcohol is in your drink. In the [place] one drink equals one 12 oz bottle of beer (355 mL), one 5 oz glass of wine (148 mL), or one 1? oz glass of hard liquor (44 mL). ?Lifestyle ?Do not use any products that contain nicotine or tobacco. These products include cigarettes, chewing tobacco, and vaping devices, such as e-cigarettes. If you need help quitting, ask your health care provider. ?Do not use street drugs. ?Do not share needles. ?Ask your health care provider for help if you need support or information about quitting drugs. ?General instructions ?Schedule regular health, dental, and eye exams. ?Stay current with your vaccines. ?Tell your health care provider if: ?You often feel depressed. ?You have ever been abused or do not feel safe at home. ?Summary ?Adopting a healthy lifestyle and getting preventive  care are important in promoting health and wellness. ?Follow your health care provider's instructions about healthy diet, exercising, and getting tested or screened for diseases. ?Follow your health care provider's instructions on monitoring your cholesterol and blood pressure. ?This information is not intended to replace advice given to you by your health care provider. Make sure you discuss any questions you have with your health care provider. ?Document Revised: 07/04/2020 Document Reviewed: 07/04/2020 ?Elsevier Patient Education ? [place]. ? ?

## 2021-07-12 NOTE — Assessment & Plan Note (Signed)
Clinical breast exam performed today.  Deferred pelvic exam in the absence of complaints and Pap smear is up-to-date.  Patient will schedule her mammogram.  Encouraged continued exercise.  Tdap given ?

## 2021-07-13 LAB — LIPID PANEL
Cholesterol: 200 mg/dL (ref 0–200)
HDL: 48.2 mg/dL (ref >39.00)
LDL Cholesterol: 141 mg/dL — ABNORMAL HIGH (ref 0–99)
NonHDL: 151.49
Total CHOL/HDL Ratio: 4
Triglycerides: 51 mg/dL (ref 0.0–149.0)
VLDL: 10.2 mg/dL (ref 0.0–40.0)

## 2021-07-13 LAB — COMPREHENSIVE METABOLIC PANEL
ALT: 11 U/L (ref 0–35)
AST: 16 U/L (ref 0–37)
Albumin: 4.3 g/dL (ref 3.5–5.2)
Alkaline Phosphatase: 51 U/L (ref 39–117)
BUN: 9 mg/dL (ref 6–23)
CO2: 25 mEq/L (ref 19–32)
Calcium: 9.3 mg/dL (ref 8.4–10.5)
Chloride: 103 mEq/L (ref 96–112)
Creatinine, Ser: 0.68 mg/dL (ref 0.40–1.20)
GFR: 104.62 mL/min (ref >60.00)
Glucose, Bld: 70 mg/dL (ref 70–99)
Potassium: 3.8 mEq/L (ref 3.5–5.1)
Sodium: 138 mEq/L (ref 135–145)
Total Bilirubin: 1.5 mg/dL — ABNORMAL HIGH (ref 0.2–1.2)
Total Protein: 6.9 g/dL (ref 6.0–8.3)

## 2021-07-13 LAB — CBC WITH DIFFERENTIAL/PLATELET
Basophils Absolute: 0.1 10*3/uL (ref 0.0–0.1)
Basophils Relative: 0.8 % (ref 0.0–3.0)
Eosinophils Absolute: 0 10*3/uL (ref 0.0–0.7)
Eosinophils Relative: 0.6 % (ref 0.0–5.0)
HCT: 36.8 % (ref 36.0–46.0)
Hemoglobin: 12.6 g/dL (ref 12.0–15.0)
Lymphocytes Relative: 30.2 % (ref 12.0–46.0)
Lymphs Abs: 2 10*3/uL (ref 0.7–4.0)
MCHC: 34.2 g/dL (ref 30.0–36.0)
MCV: 94.3 fl (ref 78.0–100.0)
Monocytes Absolute: 0.4 10*3/uL (ref 0.1–1.0)
Monocytes Relative: 5.7 % (ref 3.0–12.0)
Neutro Abs: 4.2 10*3/uL (ref 1.4–7.7)
Neutrophils Relative %: 62.7 % (ref 43.0–77.0)
Platelets: 314 10*3/uL (ref 150.0–400.0)
RBC: 3.9 Mil/uL (ref 3.87–5.11)
RDW: 12.3 % (ref 11.5–15.5)
WBC: 6.7 10*3/uL (ref 4.0–10.5)

## 2021-07-13 LAB — TSH: TSH: 1.1 u[IU]/mL (ref 0.35–5.50)

## 2021-07-13 LAB — VITAMIN D 25 HYDROXY (VIT D DEFICIENCY, FRACTURES): VITD: 16.54 ng/mL — ABNORMAL LOW (ref 30.00–100.00)

## 2021-07-13 LAB — HEMOGLOBIN A1C: Hgb A1c MFr Bld: 5.2 % (ref 4.6–6.5)

## 2021-07-25 ENCOUNTER — Other Ambulatory Visit: Payer: Self-pay | Admitting: Family

## 2021-07-25 ENCOUNTER — Encounter: Payer: Self-pay | Admitting: Plastic Surgery

## 2021-07-25 ENCOUNTER — Ambulatory Visit (INDEPENDENT_AMBULATORY_CARE_PROVIDER_SITE_OTHER): Payer: Self-pay | Admitting: Plastic Surgery

## 2021-07-25 DIAGNOSIS — Z719 Counseling, unspecified: Secondary | ICD-10-CM

## 2021-07-25 NOTE — Progress Notes (Signed)

## 2021-08-09 DIAGNOSIS — M9903 Segmental and somatic dysfunction of lumbar region: Secondary | ICD-10-CM | POA: Diagnosis not present

## 2021-08-09 DIAGNOSIS — M461 Sacroiliitis, not elsewhere classified: Secondary | ICD-10-CM | POA: Diagnosis not present

## 2021-08-09 DIAGNOSIS — M9905 Segmental and somatic dysfunction of pelvic region: Secondary | ICD-10-CM | POA: Diagnosis not present

## 2021-08-09 DIAGNOSIS — M9902 Segmental and somatic dysfunction of thoracic region: Secondary | ICD-10-CM | POA: Diagnosis not present

## 2021-08-09 DIAGNOSIS — M546 Pain in thoracic spine: Secondary | ICD-10-CM | POA: Diagnosis not present

## 2021-08-09 DIAGNOSIS — M5137 Other intervertebral disc degeneration, lumbosacral region: Secondary | ICD-10-CM | POA: Diagnosis not present

## 2021-08-15 ENCOUNTER — Ambulatory Visit
Admission: RE | Admit: 2021-08-15 | Discharge: 2021-08-15 | Disposition: A | Discharge status: Home / Self Care | Payer: 59 | Source: Ambulatory Visit | Attending: Family | Admitting: Family

## 2021-08-15 DIAGNOSIS — Z1231 Encounter for screening mammogram for malignant neoplasm of breast: Secondary | ICD-10-CM | POA: Diagnosis not present

## 2021-08-22 DIAGNOSIS — H524 Presbyopia: Secondary | ICD-10-CM | POA: Diagnosis not present

## 2021-08-22 DIAGNOSIS — H16403 Unspecified corneal neovascularization, bilateral: Secondary | ICD-10-CM | POA: Diagnosis not present

## 2021-08-22 DIAGNOSIS — H52223 Regular astigmatism, bilateral: Secondary | ICD-10-CM | POA: Diagnosis not present

## 2021-08-22 DIAGNOSIS — H5213 Myopia, bilateral: Secondary | ICD-10-CM | POA: Diagnosis not present

## 2021-09-06 DIAGNOSIS — M9902 Segmental and somatic dysfunction of thoracic region: Secondary | ICD-10-CM | POA: Diagnosis not present

## 2021-09-06 DIAGNOSIS — M546 Pain in thoracic spine: Secondary | ICD-10-CM | POA: Diagnosis not present

## 2021-09-06 DIAGNOSIS — M461 Sacroiliitis, not elsewhere classified: Secondary | ICD-10-CM | POA: Diagnosis not present

## 2021-09-06 DIAGNOSIS — M9905 Segmental and somatic dysfunction of pelvic region: Secondary | ICD-10-CM | POA: Diagnosis not present

## 2021-09-06 DIAGNOSIS — M5137 Other intervertebral disc degeneration, lumbosacral region: Secondary | ICD-10-CM | POA: Diagnosis not present

## 2021-09-06 DIAGNOSIS — M9903 Segmental and somatic dysfunction of lumbar region: Secondary | ICD-10-CM | POA: Diagnosis not present

## 2021-10-11 DIAGNOSIS — M9903 Segmental and somatic dysfunction of lumbar region: Secondary | ICD-10-CM | POA: Diagnosis not present

## 2021-10-11 DIAGNOSIS — M5137 Other intervertebral disc degeneration, lumbosacral region: Secondary | ICD-10-CM | POA: Diagnosis not present

## 2021-10-11 DIAGNOSIS — M461 Sacroiliitis, not elsewhere classified: Secondary | ICD-10-CM | POA: Diagnosis not present

## 2021-10-11 DIAGNOSIS — M546 Pain in thoracic spine: Secondary | ICD-10-CM | POA: Diagnosis not present

## 2021-10-11 DIAGNOSIS — M9905 Segmental and somatic dysfunction of pelvic region: Secondary | ICD-10-CM | POA: Diagnosis not present

## 2021-10-11 DIAGNOSIS — M9902 Segmental and somatic dysfunction of thoracic region: Secondary | ICD-10-CM | POA: Diagnosis not present

## 2021-11-07 ENCOUNTER — Ambulatory Visit (INDEPENDENT_AMBULATORY_CARE_PROVIDER_SITE_OTHER): Payer: Self-pay | Admitting: Plastic Surgery

## 2021-11-07 DIAGNOSIS — Z719 Counseling, unspecified: Secondary | ICD-10-CM

## 2021-11-07 NOTE — Progress Notes (Signed)

## 2021-11-15 DIAGNOSIS — M546 Pain in thoracic spine: Secondary | ICD-10-CM | POA: Diagnosis not present

## 2021-11-15 DIAGNOSIS — M9905 Segmental and somatic dysfunction of pelvic region: Secondary | ICD-10-CM | POA: Diagnosis not present

## 2021-11-15 DIAGNOSIS — M461 Sacroiliitis, not elsewhere classified: Secondary | ICD-10-CM | POA: Diagnosis not present

## 2021-11-15 DIAGNOSIS — M9902 Segmental and somatic dysfunction of thoracic region: Secondary | ICD-10-CM | POA: Diagnosis not present

## 2021-11-15 DIAGNOSIS — M5137 Other intervertebral disc degeneration, lumbosacral region: Secondary | ICD-10-CM | POA: Diagnosis not present

## 2021-11-15 DIAGNOSIS — M9903 Segmental and somatic dysfunction of lumbar region: Secondary | ICD-10-CM | POA: Diagnosis not present

## 2021-12-04 ENCOUNTER — Other Ambulatory Visit: Payer: Self-pay

## 2021-12-04 ENCOUNTER — Ambulatory Visit: Payer: 59 | Admitting: Dermatology

## 2021-12-04 DIAGNOSIS — L988 Other specified disorders of the skin and subcutaneous tissue: Secondary | ICD-10-CM

## 2021-12-04 DIAGNOSIS — Z1283 Encounter for screening for malignant neoplasm of skin: Secondary | ICD-10-CM | POA: Diagnosis not present

## 2021-12-04 DIAGNOSIS — Z86018 Personal history of other benign neoplasm: Secondary | ICD-10-CM | POA: Diagnosis not present

## 2021-12-04 DIAGNOSIS — D239 Other benign neoplasm of skin, unspecified: Secondary | ICD-10-CM

## 2021-12-04 DIAGNOSIS — D1801 Hemangioma of skin and subcutaneous tissue: Secondary | ICD-10-CM

## 2021-12-04 DIAGNOSIS — D229 Melanocytic nevi, unspecified: Secondary | ICD-10-CM

## 2021-12-04 DIAGNOSIS — L570 Actinic keratosis: Secondary | ICD-10-CM

## 2021-12-04 DIAGNOSIS — D2239 Melanocytic nevi of other parts of face: Secondary | ICD-10-CM

## 2021-12-04 DIAGNOSIS — Z85820 Personal history of malignant melanoma of skin: Secondary | ICD-10-CM

## 2021-12-04 DIAGNOSIS — Z85828 Personal history of other malignant neoplasm of skin: Secondary | ICD-10-CM

## 2021-12-04 DIAGNOSIS — D489 Neoplasm of uncertain behavior, unspecified: Secondary | ICD-10-CM

## 2021-12-04 DIAGNOSIS — L821 Other seborrheic keratosis: Secondary | ICD-10-CM

## 2021-12-04 DIAGNOSIS — L814 Other melanin hyperpigmentation: Secondary | ICD-10-CM | POA: Diagnosis not present

## 2021-12-04 DIAGNOSIS — L578 Other skin changes due to chronic exposure to nonionizing radiation: Secondary | ICD-10-CM

## 2021-12-04 MED ORDER — TRETINOIN 0.025 % EX CREA
TOPICAL_CREAM | Freq: Every day | CUTANEOUS | 11 refills | Status: AC
  Filled 2021-12-04: qty 45, 30d supply, fill #0
  Filled 2022-02-21: qty 45, 30d supply, fill #1
  Filled 2022-09-03 – 2022-09-04 (×2): qty 45, 30d supply, fill #2
  Filled 2022-11-29: qty 45, 30d supply, fill #3

## 2021-12-04 NOTE — Progress Notes (Signed)
Follow-Up Visit   Subjective  Christine Petty is a 46 y.o. female who presents for the following: Annual Exam (6 month tbse, area at left temple would like checked. , spot at left side of nose and cheek would like checked./Hx of aks, hx of melanoma, hx of bcc, hx of dysplastic ). The patient presents for Total-Body Skin Exam (TBSE) for skin cancer screening and mole check.  The patient has spots, moles and lesions to be evaluated, some may be new or changing and the patient has concerns that these could be cancer.  The following portions of the chart were reviewed this encounter and updated as appropriate:  Tobacco  Allergies  Meds  Problems  Med Hx  Surg Hx  Fam Hx     Review of Systems: No other skin or systemic complaints except as noted in HPI or Assessment and Plan.  Objective  Well appearing patient in no apparent distress; mood and affect are within normal limits.  A full examination was performed including scalp, head, eyes, ears, nose, lips, neck, chest, axillae, abdomen, back, buttocks, bilateral upper extremities, bilateral lower extremities, hands, feet, fingers, toes, fingernails, and toenails. All findings within normal limits unless otherwise noted below.  left cheek lateral to mid nasolabial fold 0.6 cm pink flesh papule      Left Forehead 0.3 pink flat papule      face Rhytides and volume loss.    Assessment & Plan  Neoplasm of uncertain behavior (2) left cheek lateral to mid nasolabial fold Epidermal / dermal shaving  Lesion diameter (cm):  0.6 Informed consent: discussed and consent obtained   Timeout: patient name, date of birth, surgical site, and procedure verified   Procedure prep:  Patient was prepped and draped in usual sterile fashion Prep type:  Isopropyl alcohol Anesthesia: the lesion was anesthetized in a standard fashion   Anesthetic:  1% lidocaine w/ epinephrine 1-100,000 buffered w/ 8.4% NaHCO3 Instrument used: flexible razor blade    Hemostasis achieved with: pressure, aluminum chloride and electrodesiccation   Outcome: patient tolerated procedure well   Post-procedure details: sterile dressing applied and wound care instructions given   Dressing type: bandage and petrolatum    Specimen 1 - Surgical pathology Differential Diagnosis: R/o nevus vs bcc vs other  Check Margins: No  Left Forehead Epidermal / dermal shaving  Lesion diameter (cm):  0.3 Informed consent: discussed and consent obtained   Timeout: patient name, date of birth, surgical site, and procedure verified   Procedure prep:  Patient was prepped and draped in usual sterile fashion Prep type:  Isopropyl alcohol Anesthesia: the lesion was anesthetized in a standard fashion   Anesthetic:  1% lidocaine w/ epinephrine 1-100,000 buffered w/ 8.4% NaHCO3 Instrument used: flexible razor blade   Hemostasis achieved with: pressure, aluminum chloride and electrodesiccation   Outcome: patient tolerated procedure well   Post-procedure details: sterile dressing applied and wound care instructions given   Dressing type: bandage and petrolatum    Destruction of lesion Complexity: extensive   Destruction method: electrodesiccation and curettage   Informed consent: discussed and consent obtained   Timeout:  patient name, date of birth, surgical site, and procedure verified Procedure prep:  Patient was prepped and draped in usual sterile fashion Prep type:  Isopropyl alcohol Anesthesia: the lesion was anesthetized in a standard fashion   Anesthetic:  1% lidocaine w/ epinephrine 1-100,000 buffered w/ 8.4% NaHCO3 Curettage performed in three different directions: Yes   Electrodesiccation performed over the curetted area:  Yes   Lesion length (cm):  0.3 Lesion width (cm):  0.3 Margin per side (cm):  0.2 Final wound size (cm):  0.7 Hemostasis achieved with:  pressure, aluminum chloride and electrodesiccation Outcome: patient tolerated procedure well with no  complications   Post-procedure details: sterile dressing applied and wound care instructions given   Dressing type: bandage and petrolatum    Specimen 2 - Surgical pathology Differential Diagnosis: R/o nevus vs bcc vs other  Check Margins: No R/o nevus vs bcc vs other   Elastosis of skin with actinic changes face Start  Tretinoin 0.025 % cream apply to face nightly   Topical retinoid medications like tretinoin/Retin-A, adapalene/Differin, tazarotene/Fabior, and Epiduo/Epiduo Forte can cause dryness and irritation when first started. Only apply a pea-sized amount to the entire affected area. Avoid applying it around the eyes, edges of mouth and creases at the nose. If you experience irritation, use a good moisturizer first and/or apply the medicine less often. If you are doing well with the medicine, you can increase how often you use it until you are applying every night. Be careful with sun protection while using this medication as it can make you sensitive to the sun. This medicine should not be used by pregnant women.   tretinoin (RETIN-A) 0.025 % cream - face Apply topically at bedtime. Apply pea sized amount to face  Lentigines - Scattered tan macules - Due to sun exposure - Benign-appearing, observe - Recommend daily broad spectrum sunscreen SPF 30+ to sun-exposed areas, reapply every 2 hours as needed. - Call for any changes  Seborrheic Keratoses - Stuck-on, waxy, tan-brown papules and/or plaques  - Benign-appearing - Discussed benign etiology and prognosis. - Observe - Call for any changes  Melanocytic Nevi - Tan-brown and/or pink-flesh-colored symmetric macules and papules - Benign appearing on exam today - Observation - Call clinic for new or changing moles - Recommend daily use of broad spectrum spf 30+ sunscreen to sun-exposed areas.   Hemangiomas - Red papules - Discussed benign nature - Observe - Call for any changes  Actinic Damage - Chronic condition,  secondary to cumulative UV/sun exposure - diffuse scaly erythematous macules with underlying dyspigmentation - Recommend daily broad spectrum sunscreen SPF 30+ to sun-exposed areas, reapply every 2 hours as needed.  - Staying in the shade or wearing long sleeves, sun glasses (UVA+UVB protection) and wide brim hats (4-inch brim around the entire circumference of the hat) are also recommended for sun protection.  - Call for new or changing lesions.  .History of Basal Cell Carcinoma of the Skin Multiple areas see history  - No evidence of recurrence today - Recommend regular full body skin exams - Recommend daily broad spectrum sunscreen SPF 30+ to sun-exposed areas, reapply every 2 hours as needed.  - Call if any new or changing lesions are noted between office visits   History of Dysplastic Nevi Multiple areas see history  - No evidence of recurrence today - Recommend regular full body skin exams - Recommend daily broad spectrum sunscreen SPF 30+ to sun-exposed areas, reapply every 2 hours as needed.  - Call if any new or changing lesions are noted between office visits   History of Melanoma - R lat neck WLE 2010, neg sentinel node - No evidence of recurrence today - No lymphadenopathy - Recommend regular full body skin exams - Recommend daily broad spectrum sunscreen SPF 30+ to sun-exposed areas, reapply every 2 hours as needed.  - Call if any new or changing lesions  are noted between office visits  Skin cancer screening performed today. Return in about 6 months (around 06/05/2022) for TBSE. IRuthell Rummage, CMA, am acting as scribe for Sarina Ser, MD. Documentation: I have reviewed the above documentation for accuracy and completeness, and I agree with the above.  Sarina Ser, MD

## 2021-12-04 NOTE — Patient Instructions (Addendum)
Electrodesiccation and Curettage ("Scrape and Burn") Wound Care Instructions  Leave the original bandage on for 24 hours if possible.  If the bandage becomes soaked or soiled before that time, it is OK to remove it and examine the wound.  A small amount of post-operative bleeding is normal.  If excessive bleeding occurs, remove the bandage, place gauze over the site and apply continuous pressure (no peeking) over the area for 30 minutes. If this does not work, please call our clinic as soon as possible or page your doctor if it is after hours.   Once a day, cleanse the wound with soap and water. It is fine to shower. If a thick crust develops you may use a Q-tip dipped into dilute hydrogen peroxide (mix 1:1 with water) to dissolve it.  Hydrogen peroxide can slow the healing process, so use it only as needed.    After washing, apply petroleum jelly (Vaseline) or an antibiotic ointment if your doctor prescribed one for you, followed by a bandage.    For best healing, the wound should be covered with a layer of ointment at all times. If you are not able to keep the area covered with a bandage to hold the ointment in place, this may mean re-applying the ointment several times a day.  Continue this wound care until the wound has healed and is no longer open. It may take several weeks for the wound to heal and close.  Itching and mild discomfort is normal during the healing process.  If you have any discomfort, you can take Tylenol (acetaminophen) or ibuprofen as directed on the bottle. (Please do not take these if you have an allergy to them or cannot take them for another reason).  Some redness, tenderness and white or yellow material in the wound is normal healing.  If the area becomes very sore and red, or develops a thick yellow-green material (pus), it may be infected; please notify us.    Wound healing continues for up to one year following surgery. It is not unusual to experience pain in the  scar from time to time during the interval.  If the pain becomes severe or the scar thickens, you should notify the office.    A slight amount of redness in a scar is expected for the first six months.  After six months, the redness will fade and the scar will soften and fade.  The color difference becomes less noticeable with time.  If there are any problems, return for a post-op surgery check at your earliest convenience.  To improve the appearance of the scar, you can use silicone scar gel, cream, or sheets (such as Mederma or Serica) every night for up to one year. These are available over the counter (without a prescription).  Please call our office at [phone] for any questions or concerns.      Biopsy Wound Care Instructions  Leave the original bandage on for 24 hours if possible.  If the bandage becomes soaked or soiled before that time, it is OK to remove it and examine the wound.  A small amount of post-operative bleeding is normal.  If excessive bleeding occurs, remove the bandage, place gauze over the site and apply continuous pressure (no peeking) over the area for 30 minutes. If this does not work, please call our clinic as soon as possible or page your doctor if it is after hours.   Once a day, cleanse the wound with soap and water. It  is fine to shower. If a thick crust develops you may use a Q-tip dipped into dilute hydrogen peroxide (mix 1:1 with water) to dissolve it.  Hydrogen peroxide can slow the healing process, so use it only as needed.    After washing, apply petroleum jelly (Vaseline) or an antibiotic ointment if your doctor prescribed one for you, followed by a bandage.    For best healing, the wound should be covered with a layer of ointment at all times. If you are not able to keep the area covered with a bandage to hold the ointment in place, this may mean re-applying the ointment several times a day.  Continue this wound care until the wound has healed and is  no longer open.   Itching and mild discomfort is normal during the healing process. However, if you develop pain or severe itching, please call our office.   If you have any discomfort, you can take Tylenol (acetaminophen) or ibuprofen as directed on the bottle. (Please do not take these if you have an allergy to them or cannot take them for another reason).  Some redness, tenderness and white or yellow material in the wound is normal healing.  If the area becomes very sore and red, or develops a thick yellow-green material (pus), it may be infected; please notify us.    If you have stitches, return to clinic as directed to have the stitches removed. You will continue wound care for 2-3 days after the stitches are removed.   Wound healing continues for up to one year following surgery. It is not unusual to experience pain in the scar from time to time during the interval.  If the pain becomes severe or the scar thickens, you should notify the office.    A slight amount of redness in a scar is expected for the first six months.  After six months, the redness will fade and the scar will soften and fade.  The color difference becomes less noticeable with time.  If there are any problems, return for a post-op surgery check at your earliest convenience.  To improve the appearance of the scar, you can use silicone scar gel, cream, or sheets (such as Mederma or Serica) every night for up to one year. These are available over the counter (without a prescription).  Please call our office at [phone] for any questions or concerns.     Topical retinoid medications like tretinoin/Retin-A, adapalene/Differin, tazarotene/Fabior, and Epiduo/Epiduo Forte can cause dryness and irritation when first started. Only apply a pea-sized amount to the entire affected area. Avoid applying it around the eyes, edges of mouth and creases at the nose. If you experience irritation, use a good moisturizer first and/or  apply the medicine less often. If you are doing well with the medicine, you can increase how often you use it until you are applying every night. Be careful with sun protection while using this medication as it can make you sensitive to the sun. This medicine should not be used by pregnant women.       Melanoma ABCDEs  Melanoma is the most dangerous type of skin cancer, and is the leading cause of death from skin disease.  You are more likely to develop melanoma if you: Have light-colored skin, light-colored eyes, or red or blond hair Spend a lot of time in the sun Tan regularly, either outdoors or in a tanning bed Have had blistering sunburns, especially during childhood Have a close family member who has  had a melanoma Have atypical moles or large birthmarks  Early detection of melanoma is key since treatment is typically straightforward and cure rates are extremely high if we catch it early.   The first sign of melanoma is often a change in a mole or a new dark spot.  The ABCDE system is a way of remembering the signs of melanoma.  A for asymmetry:  The two halves do not match. B for border:  The edges of the growth are irregular. C for color:  A mixture of colors are present instead of an even brown color. D for diameter:  Melanomas are usually (but not always) greater than 82m - the size of a pencil eraser. E for evolution:  The spot keeps changing in size, shape, and color.  Please check your skin once per month between visits. You can use a small mirror in front and a large mirror behind you to keep an eye on the back side or your body.   If you see any new or changing lesions before your next follow-up, please call to schedule a visit.  Please continue daily skin protection including broad spectrum sunscreen SPF 30+ to sun-exposed areas, reapplying every 2 hours as needed when you're outdoors.   Staying in the shade or wearing long sleeves, sun glasses (UVA+UVB protection) and  wide brim hats (4-inch brim around the entire circumference of the hat) are also recommended for sun protection.    Due to recent changes in healthcare laws, you may see results of your pathology and/or laboratory studies on MyChart before the doctors have had a chance to review them. We understand that in some cases there may be results that are confusing or concerning to you. Please understand that not all results are received at the same time and often the doctors may need to interpret multiple results in order to provide you with the best plan of care or course of treatment. Therefore, we ask that you please give u[place]2 business days to thoroughly review all your results before contacting the office for clarification. Should we see a critical lab result, you will be contacted sooner.   If You Need Anything After Your Visit  If you have any questions or concerns for your doctor, please call our main line at [phone] press option 4 to reach your doctor's medical assistant. If no one answers, please leave a voicemail as directed and we will return your call as soon as possible. Messages left after 4 pm will be answered the following business day.   You may also send [place] message via [place] We typically respond to MyChart messages within 1-2 business days.  For prescription refills, please ask your pharmacy to contact our office. Our fax number is [phone]  If you have an urgent issue when the clinic is closed that cannot wait until the next business day, you can page your doctor at the number below.    Please note that while we do our best to be available for urgent issues outside of office hours, we are not available 24/7.   If you have an urgent issue and are unable to reach u[place] you may choose to seek medical care at your doctor's office, retail clinic, urgent care center, or emergency room.  If you have a medical emergency, please immediately call 911 or go to the emergency  department.  Pager Numbers  - Dr. KNehemiah Massed [phone] - Dr. MLaurence Ferrari [phone] - Dr. SNicole Kindred [phone]  In the event of inclement weather, please call our main line at [phone] for an update on the status of any delays or closures.  Dermatology Medication Tips: Please keep the boxes that topical medications come in in order to help keep track of the instructions about where and how to use these. Pharmacies typically print the medication instructions only on the boxes and not directly on the medication tubes.   If your medication is too expensive, please contact our office at [phone] option 4 or send [place] a message through [place].   We are unable to tell what your co-pay for medications will be in advance as this is different depending on your insurance coverage. However, we may be able to find a substitute medication at lower cost or fill out paperwork to get insurance to cover a needed medication.   If a prior authorization is required to get your medication covered by your insurance company, please allow [place] 1-2 business days to complete this process.  Drug prices often vary depending on where the prescription is filled and some pharmacies may offer cheaper prices.  The website www.goodrx.com contains coupons for medications through different pharmacies. The prices here do not account for what the cost may be with help from insurance (it may be cheaper with your insurance), but the website can give you the price if you did not use any insurance.  - You can print the associated coupon and take it with your prescription to the pharmacy.  - You may also stop by our office during regular business hours and pick up a GoodRx coupon card.  - If you need your prescription sent electronically to a different pharmacy, notify our office through [facility] or by phone at [phone] option 4.     Si Usted Necesita Algo Despus de Su Visita  Tambin puede enviarnos un  mensaje a travs de Pharmacist, community. Por lo general respondemos a los mensajes de MyChart en el transcurso de 1 a 2 das hbiles.  Para renovar recetas, por favor pida a su farmacia que se ponga en contacto con nuestra oficina. Harland Dingwall de fax es [place] [phone].  Si tiene un asunto urgente cuando la clnica est cerrada y que no puede esperar hasta el siguiente da hbil, puede llamar/localizar a su doctor(a) al nmero que aparece a continuacin.   Por favor, tenga en cuenta que aunque hacemos todo lo posible para estar disponibles para asuntos urgentes fuera del horario de [place], no estamos disponibles las 24 horas del da, los 7 das de la [place].   Si tiene un problema urgente y no puede comunicarse con nosotros, puede optar por buscar atencin mdica  en el consultorio de su doctor(a), en una clnica privada, en un centro de atencin urgente o en una sala de emergencias.  Si tiene Engineering geologist, por favor llame inmediatamente al 911 o vaya a la sala de emergencias.  Nmeros de bper  - Dr. Nehemiah Massed: [phone]  - Dra. Moye: [phone]  - Dra. Nicole Kindred: [phone]  En caso de inclemencias del [place], por favor llame a Johnsie Kindred principal al [phone] para una actualizacin sobre el [place] de cualquier retraso o cierre.  Consejos para la medicacin en dermatologa: Por favor, guarde las cajas en las que vienen los medicamentos de uso tpico para ayudarle a seguir las instrucciones sobre dnde y cmo usarlos. Las farmacias generalmente imprimen las instrucciones del medicamento slo en las cajas y no directamente en los tubos del [place].   Si  su medicamento es muy caro, por favor, pngase en contacto con Zigmund Daniel llamando al [phone] y presione la opcin 4 o envenos un mensaje a travs de Pharmacist, community.   No podemos decirle cul ser su copago por los medicamentos por adelantado ya que esto es diferente dependiendo de la cobertura de su seguro. Sin embargo,  es posible que podamos encontrar un medicamento sustituto a Electrical engineer un formulario para que el seguro cubra el medicamento que se considera necesario.   Si se requiere una autorizacin previa para que su compaa de seguros [place] su medicamento, por favor permtanos de 1 a 2 das hbiles para completar este proceso.  Los precios de los medicamentos varan con frecuencia dependiendo del Environmental consultant de dnde se surte la receta y alguna farmacias pueden ofrecer precios ms baratos.  El sitio web www.goodrx.com tiene cupones para medicamentos de Airline pilot. Los precios aqu no tienen en cuenta lo que podra costar con la ayuda del seguro (puede ser ms barato con su seguro), pero el sitio web puede darle el precio si no utiliz Research scientist (physical sciences).  - Puede imprimir el cupn correspondiente y llevarlo con su receta a la farmacia.  - Tambin puede pasar por nuestra oficina durante el horario de atencin regular y Charity fundraiser una tarjeta de cupones de GoodRx.  - Si necesita que su receta se enve electrnicamente a una farmacia diferente, informe a nuestra oficina a travs de MyChart de [place] o por telfono llamando al [phone] y presione la opcin 4.

## 2021-12-06 ENCOUNTER — Encounter: Payer: Self-pay | Admitting: Dermatology

## 2021-12-12 ENCOUNTER — Telehealth: Payer: Self-pay

## 2021-12-12 NOTE — Telephone Encounter (Signed)
-----   Message from David C Kowalski, MD sent at 12/12/2021 10:30 AM EDT ----- Diagnosis 1. 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 Skin , left forehead LICHENOID KERATOSIS  1- benign mole No further treatment needed 2- benign lichenoid keratosis No further treatment needed 

## 2021-12-12 NOTE — Telephone Encounter (Signed)
Left pt msg to call for bx result/sh °

## 2021-12-19 DIAGNOSIS — M461 Sacroiliitis, not elsewhere classified: Secondary | ICD-10-CM | POA: Diagnosis not present

## 2021-12-19 DIAGNOSIS — M9905 Segmental and somatic dysfunction of pelvic region: Secondary | ICD-10-CM | POA: Diagnosis not present

## 2021-12-19 DIAGNOSIS — M546 Pain in thoracic spine: Secondary | ICD-10-CM | POA: Diagnosis not present

## 2021-12-19 DIAGNOSIS — M5137 Other intervertebral disc degeneration, lumbosacral region: Secondary | ICD-10-CM | POA: Diagnosis not present

## 2021-12-19 DIAGNOSIS — M9903 Segmental and somatic dysfunction of lumbar region: Secondary | ICD-10-CM | POA: Diagnosis not present

## 2021-12-19 DIAGNOSIS — M9902 Segmental and somatic dysfunction of thoracic region: Secondary | ICD-10-CM | POA: Diagnosis not present

## 2021-12-25 ENCOUNTER — Other Ambulatory Visit: Payer: Self-pay | Admitting: General Surgery

## 2021-12-25 MED ORDER — CLOBETASOL PROPIONATE 0.05 % EX CREA: TOPICAL_CREAM | Freq: Two times a day (BID) | CUTANEOUS | Status: DC

## 2021-12-26 ENCOUNTER — Other Ambulatory Visit: Payer: Self-pay

## 2021-12-26 ENCOUNTER — Other Ambulatory Visit: Payer: Self-pay | Admitting: General Surgery

## 2021-12-26 MED ORDER — CLOBETASOL PROPIONATE 0.05 % EX CREA
1.0000 | TOPICAL_CREAM | Freq: Two times a day (BID) | CUTANEOUS | 0 refills | Status: DC
  Filled 2021-12-26: qty 30, 30d supply, fill #0

## 2021-12-27 ENCOUNTER — Other Ambulatory Visit: Payer: Self-pay

## 2021-12-27 ENCOUNTER — Other Ambulatory Visit: Payer: Self-pay | Admitting: General Surgery

## 2021-12-27 MED ORDER — PREDNISONE 10 MG PO TABS
ORAL_TABLET | ORAL | 0 refills | Status: AC
  Filled 2021-12-27: qty 45, 16d supply, fill #0

## 2021-12-28 ENCOUNTER — Telehealth: Payer: Self-pay

## 2021-12-28 NOTE — Telephone Encounter (Signed)
Left pt msg to call for bx results/sh 

## 2021-12-28 NOTE — Telephone Encounter (Signed)
-----   Message from Ralene Bathe, MD sent at 12/12/2021 10:30 AM EDT ----- Diagnosis 1. 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 Skin , left forehead LICHENOID KERATOSIS  1- benign mole No further treatment needed 2- benign lichenoid keratosis No further treatment needed

## 2022-01-09 ENCOUNTER — Telehealth: Payer: Self-pay

## 2022-01-09 NOTE — Telephone Encounter (Signed)
Advised pt of bx results/sh ?

## 2022-01-09 NOTE — Telephone Encounter (Signed)
-----   Message from Ralene Bathe, MD sent at 12/12/2021 10:30 AM EDT ----- Diagnosis 1. 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 Skin , left forehead LICHENOID KERATOSIS  1- benign mole No further treatment needed 2- benign lichenoid keratosis No further treatment needed

## 2022-01-24 DIAGNOSIS — M546 Pain in thoracic spine: Secondary | ICD-10-CM | POA: Diagnosis not present

## 2022-01-24 DIAGNOSIS — M461 Sacroiliitis, not elsewhere classified: Secondary | ICD-10-CM | POA: Diagnosis not present

## 2022-01-24 DIAGNOSIS — M9905 Segmental and somatic dysfunction of pelvic region: Secondary | ICD-10-CM | POA: Diagnosis not present

## 2022-01-24 DIAGNOSIS — M9903 Segmental and somatic dysfunction of lumbar region: Secondary | ICD-10-CM | POA: Diagnosis not present

## 2022-01-24 DIAGNOSIS — M9902 Segmental and somatic dysfunction of thoracic region: Secondary | ICD-10-CM | POA: Diagnosis not present

## 2022-01-24 DIAGNOSIS — M5137 Other intervertebral disc degeneration, lumbosacral region: Secondary | ICD-10-CM | POA: Diagnosis not present

## 2022-02-28 DIAGNOSIS — M9905 Segmental and somatic dysfunction of pelvic region: Secondary | ICD-10-CM | POA: Diagnosis not present

## 2022-02-28 DIAGNOSIS — M546 Pain in thoracic spine: Secondary | ICD-10-CM | POA: Diagnosis not present

## 2022-02-28 DIAGNOSIS — M9903 Segmental and somatic dysfunction of lumbar region: Secondary | ICD-10-CM | POA: Diagnosis not present

## 2022-02-28 DIAGNOSIS — M5137 Other intervertebral disc degeneration, lumbosacral region: Secondary | ICD-10-CM | POA: Diagnosis not present

## 2022-02-28 DIAGNOSIS — M9902 Segmental and somatic dysfunction of thoracic region: Secondary | ICD-10-CM | POA: Diagnosis not present

## 2022-02-28 DIAGNOSIS — M461 Sacroiliitis, not elsewhere classified: Secondary | ICD-10-CM | POA: Diagnosis not present

## 2022-03-19 IMAGING — MG MM DIGITAL SCREENING BILAT W/ TOMO AND CAD
8 series · 8 of 24 positions shown · non-contrast
Comparison: Previous exam(s).

CLINICAL DATA: Screening.

EXAM:
DIGITAL SCREENING BILATERAL MAMMOGRAM WITH TOMOSYNTHESIS AND CAD
TECHNIQUE: Bilateral screening digital craniocaudal and mediolateral oblique
mammograms were obtained. Bilateral screening digital breast
tomosynthesis was performed. The images were evaluated with
computer-aided detection.

[R CC synth-2D]
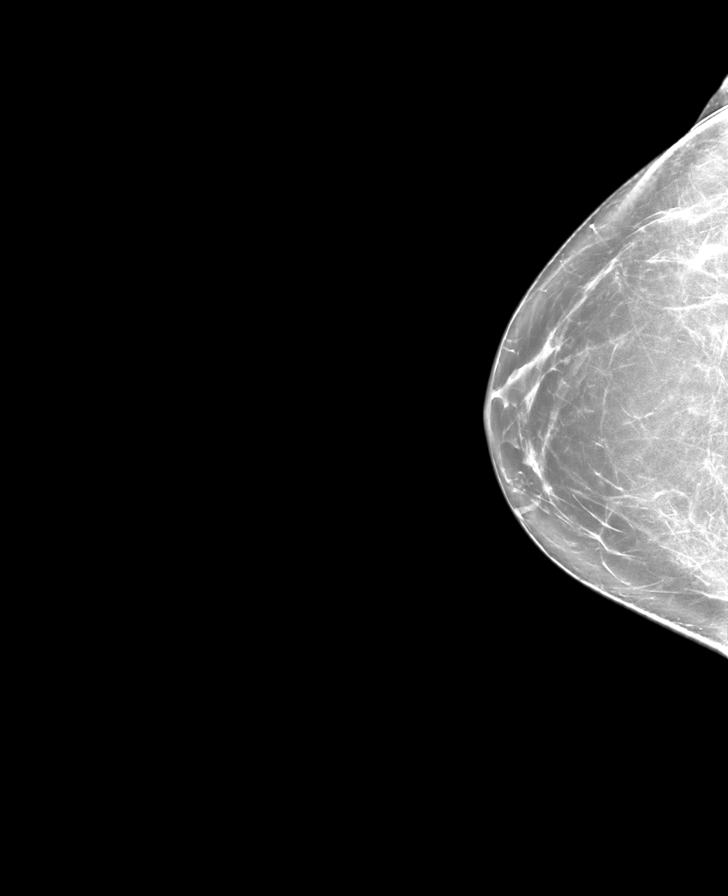

[R MLO synth-2D]
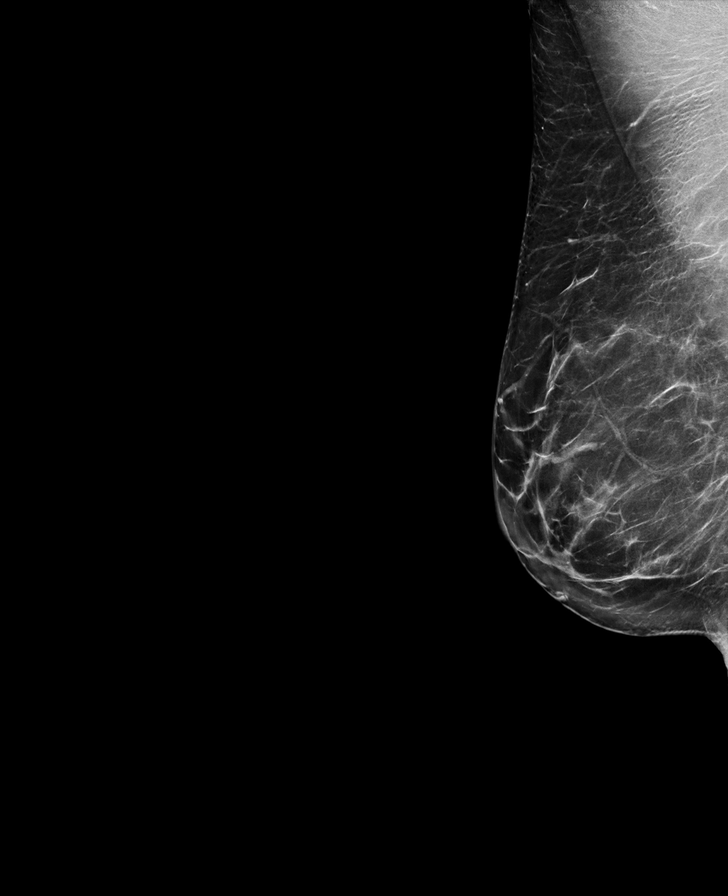

[L CC synth-2D]
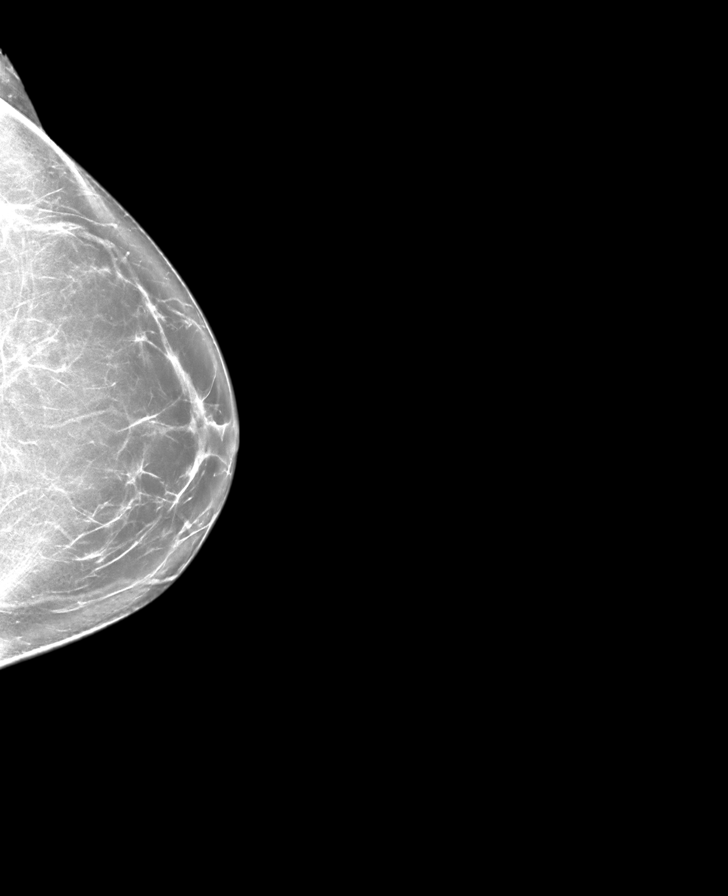

[L MLO synth-2D]
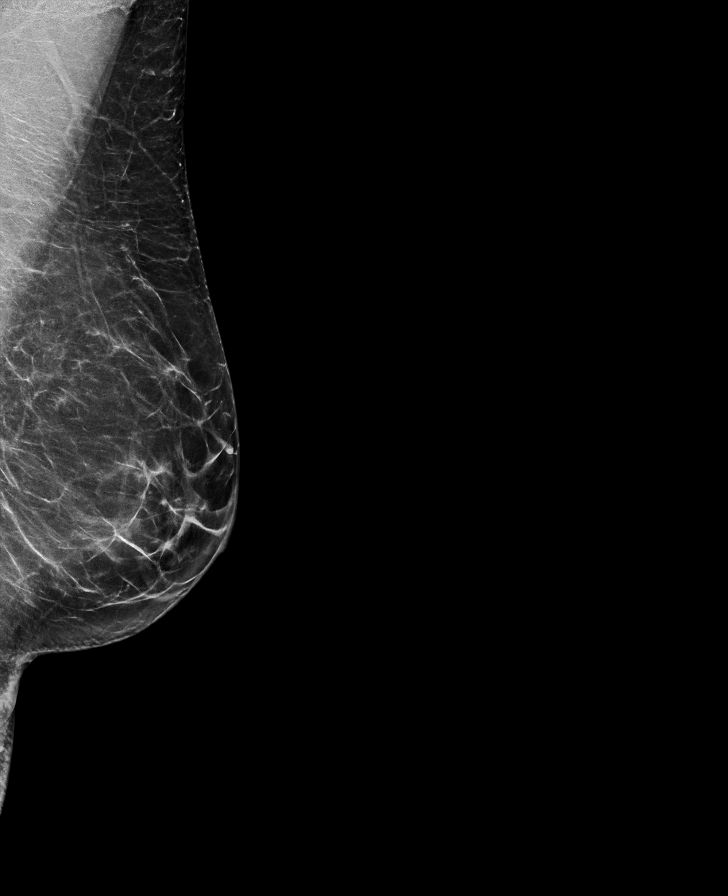

[R CC tomo · tomo slice 37/73.0]
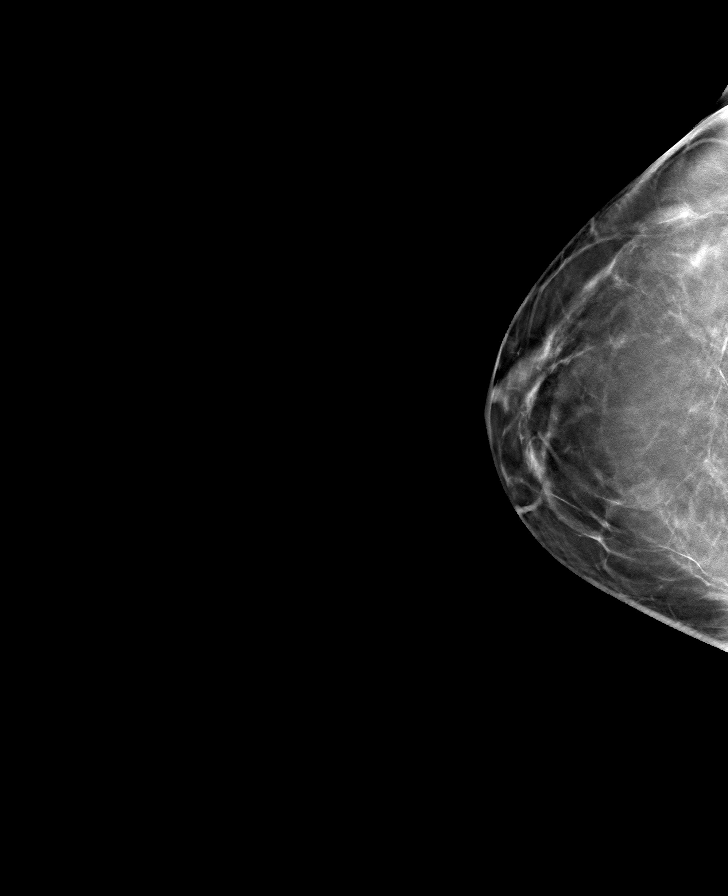

[L CC tomo · tomo slice 37/74.0]
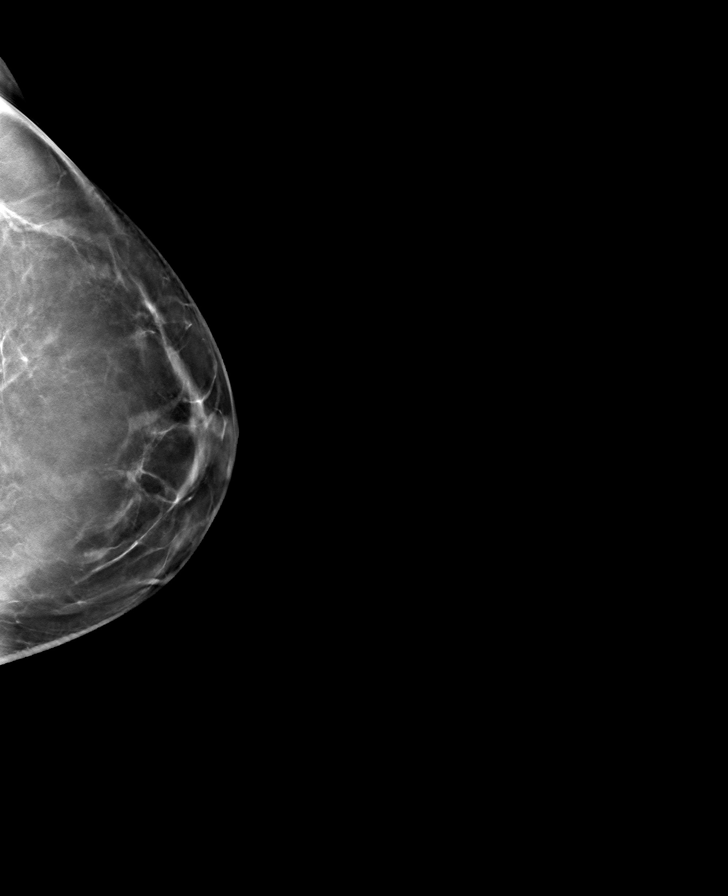

[R MLO tomo · tomo slice 39/77.0]
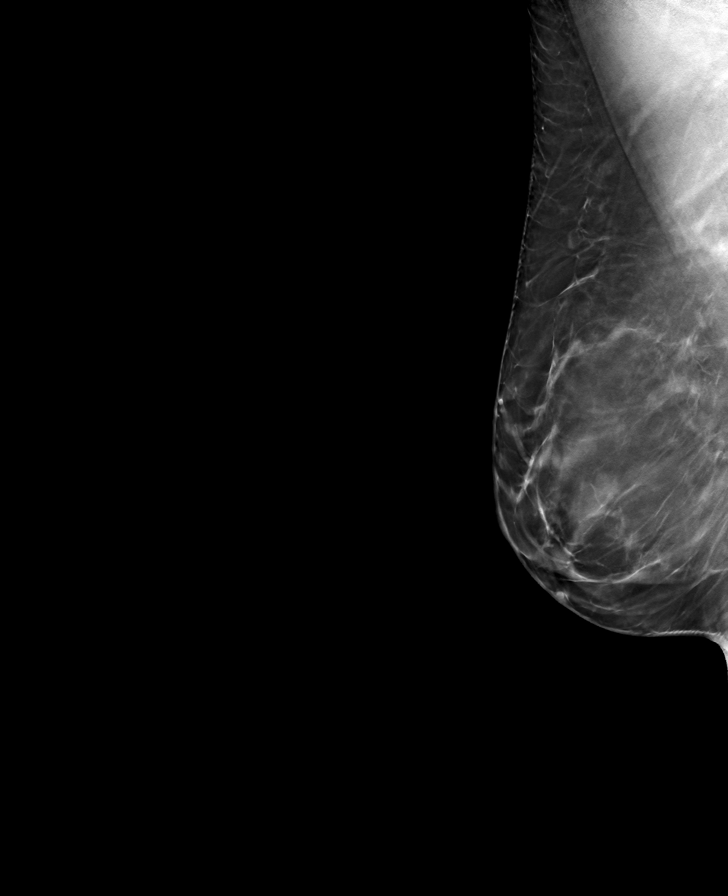

[L MLO tomo · tomo slice 35/70.0]
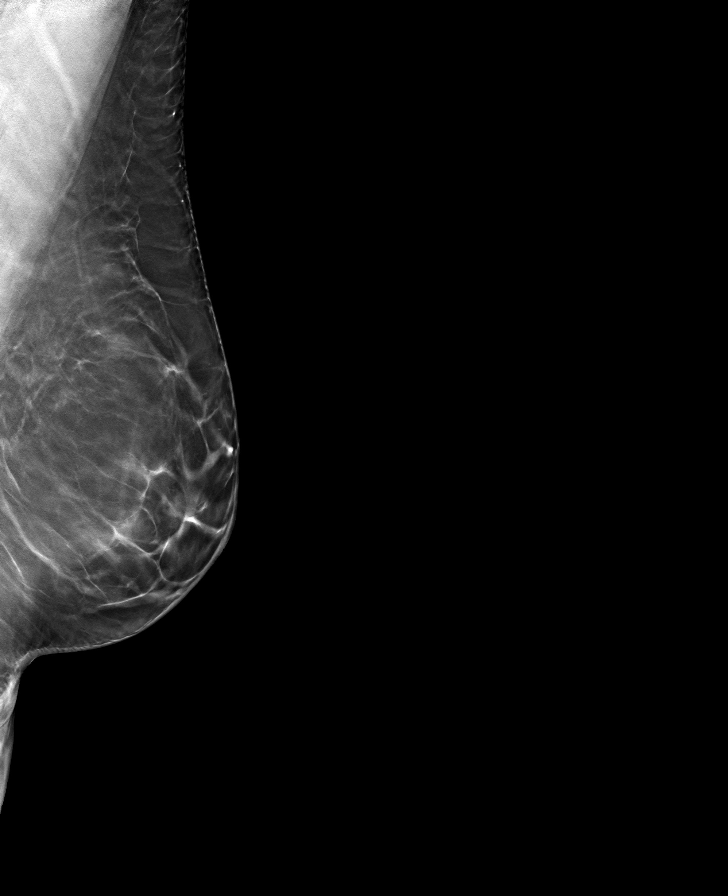

[8 of 24 positions shown; findings below may reference images not displayed]

ACR Breast Density Category b: There are scattered areas of
fibroglandular density.
FINDINGS: There are no findings suspicious for malignancy. The images were
evaluated with computer-aided detection.
IMPRESSION: No mammographic evidence of malignancy. A result letter of this
screening mammogram will be mailed directly to the patient.

RECOMMENDATION:
Screening mammogram in one year. (Code:WJ-[id number])

BI-RADS CATEGORY  1: Negative.

## 2022-03-20 ENCOUNTER — Encounter: Payer: Self-pay | Admitting: Plastic Surgery

## 2022-04-03 DIAGNOSIS — M9905 Segmental and somatic dysfunction of pelvic region: Secondary | ICD-10-CM | POA: Diagnosis not present

## 2022-04-03 DIAGNOSIS — M546 Pain in thoracic spine: Secondary | ICD-10-CM | POA: Diagnosis not present

## 2022-04-03 DIAGNOSIS — M461 Sacroiliitis, not elsewhere classified: Secondary | ICD-10-CM | POA: Diagnosis not present

## 2022-04-03 DIAGNOSIS — M5137 Other intervertebral disc degeneration, lumbosacral region: Secondary | ICD-10-CM | POA: Diagnosis not present

## 2022-04-03 DIAGNOSIS — M9902 Segmental and somatic dysfunction of thoracic region: Secondary | ICD-10-CM | POA: Diagnosis not present

## 2022-04-03 DIAGNOSIS — M9903 Segmental and somatic dysfunction of lumbar region: Secondary | ICD-10-CM | POA: Diagnosis not present

## 2022-04-10 ENCOUNTER — Ambulatory Visit (INDEPENDENT_AMBULATORY_CARE_PROVIDER_SITE_OTHER): Payer: Self-pay | Admitting: Plastic Surgery

## 2022-04-10 DIAGNOSIS — Z719 Counseling, unspecified: Secondary | ICD-10-CM

## 2022-04-10 NOTE — Progress Notes (Unsigned)

## 2022-05-01 DIAGNOSIS — M9905 Segmental and somatic dysfunction of pelvic region: Secondary | ICD-10-CM | POA: Diagnosis not present

## 2022-05-01 DIAGNOSIS — M461 Sacroiliitis, not elsewhere classified: Secondary | ICD-10-CM | POA: Diagnosis not present

## 2022-05-01 DIAGNOSIS — M9903 Segmental and somatic dysfunction of lumbar region: Secondary | ICD-10-CM | POA: Diagnosis not present

## 2022-05-01 DIAGNOSIS — M5137 Other intervertebral disc degeneration, lumbosacral region: Secondary | ICD-10-CM | POA: Diagnosis not present

## 2022-05-01 DIAGNOSIS — M546 Pain in thoracic spine: Secondary | ICD-10-CM | POA: Diagnosis not present

## 2022-05-01 DIAGNOSIS — M9902 Segmental and somatic dysfunction of thoracic region: Secondary | ICD-10-CM | POA: Diagnosis not present

## 2022-05-13 ENCOUNTER — Ambulatory Visit
Admission: EM | Admit: 2022-05-13 | Discharge: 2022-05-13 | Disposition: A | Discharge status: Home / Self Care | Payer: 59 | Attending: Family Medicine | Admitting: Family Medicine

## 2022-05-13 DIAGNOSIS — H16002 Unspecified corneal ulcer, left eye: Secondary | ICD-10-CM

## 2022-05-13 MED ORDER — ERYTHROMYCIN 5 MG/GM OP OINT: TOPICAL_OINTMENT | OPHTHALMIC | 0 refills | Status: DC

## 2022-05-13 NOTE — ED Provider Notes (Signed)
[facility]    CSN: [id number] Arrival date & time: 05/13/22  1030      History   Chief Complaint Chief Complaint  Patient presents with   Eye Problem    HPI HPI  Christine Petty is a 47 y.o. female.    Christine Petty presents for left eye pain that started on Thursday after swiping her makeup brush across her eye by mistake. She has red eye with tearing and light sensitivity.  Christine Petty feels like something is still in his eye but does not see anything. She rinsed her eye with saline prior to arrival.   Christine Petty does contacts but has been wearing her glasses.  Christine Petty has not had any trouble seeing.   Christine Petty has otherwise been well and has no additional concerns today.   Past Medical History:  Diagnosis Date   Basal cell carcinoma 03/10/2013   Right lateral forehead.    Basal cell carcinoma 09/08/2013   Right lat. canthus. Superficial.   Cancer ([place]) 2010   Skin Cancer   Dysplastic nevi 09/07/2009   Left low back 7cm lat to spine, left low back sup. to waistline 6cm lat to spine, right mid popliteal; minimal atypia   Dysplastic nevi 12/19/2009   Left inf. med scapula 6.0cm lat to spine, right mid to low back paraspinal 1.5cm lat to spine, right low back paraspinal above waistline; Moderate atypia.    Dysplastic nevi 04/22/2014   Right low scapula reshaved 05/25/2015 margins free, left low back paraspinal above sacral area. Mild atypia.   Dysplastic nevi 11/28/2015   Left medial cheek buttock midway, right mid posterior thigh closest to midline; mild atypia.    Dysplastic nevi 03/24/2018   Left epigastric, left lateral near side superior and parallel to umbilicus upper quadrant; moderate atypia.    Dysplastic nevus 08/17/2019   Right post. waistline. Moderate to marked atypia.    Dysplastic nevus 01/24/2009   RUQ abdomen. Moderate atypia   Dysplastic nevus 01/24/2009   Right low back med. Slight to moderate atypia   Dysplastic nevus 01/24/2009   Right low back lat. near side.  Slight to moderate atypia   Dysplastic nevus 01/24/2009   Mid to upper back spinal.    Dysplastic nevus 04/25/2009   Left costal margin. Moderate to severe atypia. Excised: 06/01/2009. Residual DN, edges free.   Dysplastic nevus 04/25/2009   Right epigastric costal margin. Moderate to severe atypia. Excised: 05/25/2009. Margins free.   Dysplastic nevus 04/25/2009   Right mid to low back paraspinal. Moderate atypia.   Dysplastic nevus 07/18/2009   Left epigastric costal margin inf. Mild atypia.   Dysplastic nevus 07/18/2009   Right med. inf. scapula. Moderate to severe atypia. Excised: 08/31/2009, margins free.   Dysplastic nevus 07/18/2009   Right med sup. scapula. Moderate to severe atypia. Excised: 08/31/2009, margins free.   Dysplastic nevus 09/07/2009   Right lat. inf. deltoid. Mild atypia.   Dysplastic nevus 12/19/2009   Left mid to low back inf. to braline 9.5cm lat to spine. Moderate to severe atypia. Excised: 02/01/2010, margins free.   Dysplastic nevus 09/01/2012   Left distal bicep (near antecubital). Mild atypia.   Dysplastic nevus 03/10/2013   Right ant. lat. hip. Moderate atypia.    Dysplastic nevus 09/08/2013   Right inframammary sternum. Mild melanocytic atypia.    Dysplastic nevus 05/25/2015   Low back. Mild atypia   Dysplastic nevus 05/28/2016   Left low back. Halo nevus with moderate atypia, margin involved.   Dysplastic  nevus 01/30/2017   Right pubic area. Severe atypia. Re-shaved 04/11/2017. Persistent DN, margins free.   Dysplastic nevus 01/30/2017   Right mid side. Mild atypia.   Dysplastic nevus 08/22/2017   Left inf. lat. buttocks. Mild atypia, limited margins free.   Dysplastic nevus 09/08/2018   Left low back near side. Mild atypia, deep margin involved. Re-shaved: 04/09/2019, recurrent DN, margins free.   History of frequent urinary tract infections    Melanoma ([place]) 08/16/2008   Right lateral neck. 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 Type: Nevoid. Neg  sentinal node.    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      Home Medications    Prior to Admission medications   Medication Sig Start Date End Date Taking? Authorizing Provider  clobetasol cream (TEMOVATE) [id number] % Apply 1 Application topically 2 (two) times daily. 12/26/21  Yes Herbert Pun, MD  erythromycin ophthalmic ointment Place a 1/2 inch ribbon of ointment into the lower eyelid for 4 times a day for 7 days 05/13/22  Yes Amberrose Friebel, DO  tretinoin (RETIN-A) 0.025 % cream Apply topically at bedtime. Apply pea sized amount to face 12/04/21 12/04/22 Yes Ralene Bathe, MD  COVID-19 At Home Antigen Test [facility] COVID-19 HOME TEST) KIT Use as directed 05/26/21   Letta Median, RPH  buPROPion (WELLBUTRIN XL) 150 MG 24 hr tablet Start 150 mg ER PO qam, increase after 3 days to 300 mg qam. 04/01/19 12/27/19  Burnard Hawthorne, FNP    Family History Family History  Problem Relation Age of Onset   Colon polyps Mother 31   Cancer Mother 71       Breast Cancer   Hyperlipidemia Mother    Hypertension Mother    Breast cancer Mother 34   Hyperlipidemia Father    Hypertension Father    Breast cancer Paternal Aunt 22   Colon cancer Neg Hx    Esophageal cancer Neg Hx    Stomach cancer Neg Hx    Rectal cancer Neg Hx     Social History Social History   Tobacco Use   Smoking status: Never   Smokeless tobacco: Never  Vaping Use   Vaping Use: Never used  Substance Use Topics   Alcohol use: Yes    Alcohol/week:  0.0 standard drinks of alcohol    Comment: Rare   Drug use: No     Allergies   Patient has no known allergies.   Review of Systems Review of Systems : negative unless otherwise stated in HPI.      Physical Exam Triage Vital Signs ED Triage Vitals  Enc Vitals Group     BP 05/13/22 1041 126/81     Pulse Rate 05/13/22 1041 65     Resp --      Temp 05/13/22 1041 97.9 F (36.6 C)     Temp Source 05/13/22 1041 Oral     SpO2 05/13/22 1041 95 %     Weight 05/13/22 1039 170  lb (77.1 kg)     Height 05/13/22 1039 5\' 3"  (1.6 m)     Head Circumference --      Peak Flow --      Pain Score 05/13/22 1039 2     Pain Loc --      Pain Edu? --      Excl. in [place]? --    No data found.  Updated Vital Signs BP 126/81 (BP Location: Left Arm)   Pulse 65   Temp 97.9 F (36.6 C) (Oral)   Ht 5\' 3"  (1.6 m)   Wt 77.1 kg   LMP 04/29/2022   SpO2 95%   BMI 30.11 kg/m   Visual Acuity Right Eye Distance:   Left Eye Distance:   Bilateral Distance:    Right Eye Near:   Left Eye Near:    Bilateral Near:     Physical Exam  GEN: pleasant well appearing female, in no acute distress  NECK: normal ROM  CV: regular rate  RESP: no increased work of breathing EYES:     General: left upper lid with mild edema. Vision grossly intact. Gaze aligned appropriately.        Right eye: No discharge or hordeolum..        Left eye: No foreign body, discharge or hordeolum.     Extraocular Movements: Extraocular movements intact.     Conjunctiva/sclera:     Left eye: Left conjunctiva is injected. No chemosis or hemorrhage. Posteromedial iris 1 mm corneal ulcer visible  SKIN: warm and dry   UC Treatments / Results  Labs (all labs ordered are listed, but only abnormal results are displayed) Labs Reviewed - No data to display  EKG   Radiology No results found.  Procedures Procedures (including critical care time)  Medications Ordered in UC Medications - No data to display  Initial  Impression / Assessment and Plan / UC Course  I have reviewed the triage vital signs and the nursing notes.  Pertinent labs & imaging results that were available during my care of the patient were reviewed by me and considered in my medical decision making (see chart for details).     Patient is a 47 y.o. female who presents after left eye pain with redness and watery discharge for the past  3-4 days.  On exam, she has a posteromedial corneal ulcer.  Sent erythromycin ointment to the pharmacy.  Advised to follow-up with an ophthalmologist or optometrist, if  discomfort/pain is not improving after 7-day course.  Recommended pt pick up eye patch from the pharmacy, if desired. Understanding voiced.   Discussed MDM, treatment plan and plan for follow-up with patient who agrees with plan.    Final Clinical Impressions(s) / UC Diagnoses   Final diagnoses:  Corneal ulcer of left eye     Discharge Instructions      You have a corneal ulcer.  Stop by the pharmacy to pick up your antibiotics.  If symptoms do not improve in the next week, follow-up with your optometrist/ophthalmologist.  Consider picking up a eye patch for comfort.  Wear your glasses instead of contacts.    ED Prescriptions     Medication Sig Dispense Auth. Provider   erythromycin ophthalmic ointment Place a 1/2 inch ribbon of ointment into the lower eyelid for 4 times a day for 7 days 3.5 g Lyndee Hensen, DO      PDMP not reviewed this encounter.   Lyndee Hensen, DO 05/13/22 1114

## 2022-05-13 NOTE — Discharge Instructions (Signed)
You have a corneal ulcer.  Stop by the pharmacy to pick up your antibiotics.  If symptoms do not improve in the next week, follow-up with your optometrist/ophthalmologist.  Consider picking up a eye patch for comfort.  Wear your glasses instead of contacts.

## 2022-05-13 NOTE — ED Triage Notes (Signed)
Pt c/o left eye problem  Pt states that she was doing her makeup and got her makeup brush into her eye and believe she has an abrasion.   Pt has used lubricating eye drops and that helps the pain.

## 2022-05-15 DIAGNOSIS — H16042 Marginal corneal ulcer, left eye: Secondary | ICD-10-CM | POA: Diagnosis not present

## 2022-05-17 DIAGNOSIS — H16042 Marginal corneal ulcer, left eye: Secondary | ICD-10-CM | POA: Diagnosis not present

## 2022-06-04 ENCOUNTER — Encounter: Payer: Self-pay | Admitting: Dermatology

## 2022-06-04 ENCOUNTER — Ambulatory Visit: Payer: 59 | Admitting: Dermatology

## 2022-06-04 VITALS — BP 112/74 | HR 73

## 2022-06-04 DIAGNOSIS — D229 Melanocytic nevi, unspecified: Secondary | ICD-10-CM

## 2022-06-04 DIAGNOSIS — L578 Other skin changes due to chronic exposure to nonionizing radiation: Secondary | ICD-10-CM | POA: Diagnosis not present

## 2022-06-04 DIAGNOSIS — L814 Other melanin hyperpigmentation: Secondary | ICD-10-CM | POA: Diagnosis not present

## 2022-06-04 DIAGNOSIS — Z85820 Personal history of malignant melanoma of skin: Secondary | ICD-10-CM | POA: Diagnosis not present

## 2022-06-04 DIAGNOSIS — Z85828 Personal history of other malignant neoplasm of skin: Secondary | ICD-10-CM

## 2022-06-04 DIAGNOSIS — Z1283 Encounter for screening for malignant neoplasm of skin: Secondary | ICD-10-CM | POA: Diagnosis not present

## 2022-06-04 DIAGNOSIS — D1801 Hemangioma of skin and subcutaneous tissue: Secondary | ICD-10-CM

## 2022-06-04 DIAGNOSIS — Z86018 Personal history of other benign neoplasm: Secondary | ICD-10-CM | POA: Diagnosis not present

## 2022-06-04 DIAGNOSIS — L821 Other seborrheic keratosis: Secondary | ICD-10-CM | POA: Diagnosis not present

## 2022-06-04 NOTE — Progress Notes (Signed)
   Follow-Up Visit   Subjective  Christine Petty is a 47 y.o. female who presents for the following: Skin Cancer Screening and Full Body Skin Exam The patient presents for Upper Body Skin Exam (UBSE) for skin cancer screening and mole check. The patient has spots, moles and lesions to be evaluated, some may be new or changing and the patient has concerns that these could be cancer.  The following portions of the chart were reviewed this encounter and updated as appropriate: medications, allergies, medical history  Review of Systems:  No other skin or systemic complaints except as noted in HPI or Assessment and Plan.  Objective  Well appearing patient in no apparent distress; mood and affect are within normal limits.  All skin waist up examined. Relevant physical exam findings are noted in the Assessment and Plan.   Assessment & Plan    Lentigines, Seborrheic Keratoses, Hemangiomas - Benign normal skin lesions - Benign-appearing - Call for any changes  Melanocytic Nevi - Tan-brown and/or pink-flesh-colored symmetric macules and papules - Benign appearing on exam today - Observation - Call clinic for new or changing moles - Recommend daily use of broad spectrum spf 30+ sunscreen to sun-exposed areas.   Actinic Damage - Chronic condition, secondary to cumulative UV/sun exposure - diffuse scaly erythematous macules with underlying dyspigmentation - Recommend daily broad spectrum sunscreen SPF 30+ to sun-exposed areas, reapply every 2 hours as needed.  - Staying in the shade or wearing long sleeves, sun glasses (UVA+UVB protection) and wide brim hats (4-inch brim around the entire circumference of the hat) are also recommended for sun protection.  - Call for new or changing lesions.  HISTORY OF BASAL CELL CARCINOMA OF THE SKIN Multiple sites see history  - No evidence of recurrence today - Recommend regular full body skin exams - Recommend daily broad spectrum sunscreen SPF 30+ to  sun-exposed areas, reapply every 2 hours as needed.  - Call if any new or changing lesions are noted between office visits  HISTORY OF MELANOMA R lat neck WLE 2010, neg sentinel node  Clark's level IV, Breslow's 0.41mm  - No evidence of recurrence today - No lymphadenopathy - Recommend regular full body skin exams - Recommend daily broad spectrum sunscreen SPF 30+ to sun-exposed areas, reapply every 2 hours as needed.  - Call if any new or changing lesions are noted between office visits  HISTORY OF DYSPLASTIC NEVUS Multiple sites see history No evidence of recurrence today Recommend regular full body skin exams Recommend daily broad spectrum sunscreen SPF 30+ to sun-exposed areas, reapply every 2 hours as needed.  Call if any new or changing lesions are noted between office visits  Skin cancer screening performed today.  Return in about 6 months (around 12/04/2022) for tbse.  IAsher Muir, CMA, am acting as scribe for Armida Sans, MD.  Documentation: I have reviewed the above documentation for accuracy and completeness, and I agree with the above.  Armida Sans, MD

## 2022-06-04 NOTE — Patient Instructions (Addendum)
     Melanoma ABCDEs  Melanoma is the most dangerous type of skin cancer, and is the leading cause of death from skin disease.  You are more likely to develop melanoma if you: Have light-colored skin, light-colored eyes, or red or blond hair Spend a lot of time in the sun Tan regularly, either outdoors or in a tanning bed Have had blistering sunburns, especially during childhood Have a close family member who has had a melanoma Have atypical moles or large birthmarks  Early detection of melanoma is key since treatment is typically straightforward and cure rates are extremely high if we catch it early.   The first sign of melanoma is often a change in a mole or a new dark spot.  The ABCDE system is a way of remembering the signs of melanoma.  A for asymmetry:  The two halves do not match. B for border:  The edges of the growth are irregular. C for color:  A mixture of colors are present instead of an even brown color. D for diameter:  Melanomas are usually (but not always) greater than 6mm - the size of a pencil eraser. E for evolution:  The spot keeps changing in size, shape, and color.  Please check your skin once per month between visits. You can use a small mirror in front and a large mirror behind you to keep an eye on the back side or your body.   If you see any new or changing lesions before your next follow-up, please call to schedule a visit.  Please continue daily skin protection including broad spectrum sunscreen SPF 30+ to sun-exposed areas, reapplying every 2 hours as needed when you're outdoors.   Staying in the shade or wearing long sleeves, sun glasses (UVA+UVB protection) and wide brim hats (4-inch brim around the entire circumference of the hat) are also recommended for sun protection.    Due to recent changes in healthcare laws, you may see results of your pathology and/or laboratory studies on MyChart before the doctors have had a chance to review them. We  understand that in some cases there may be results that are confusing or concerning to you. Please understand that not all results are received at the same time and often the doctors may need to interpret multiple results in order to provide you with the best plan of care or course of treatment. Therefore, we ask that you please give us 2 business days to thoroughly review all your results before contacting the office for clarification. Should we see a critical lab result, you will be contacted sooner.   If You Need Anything After Your Visit  If you have any questions or concerns for your doctor, please call our main line at [phone] and press option 4 to reach your doctor's medical assistant. If no one answers, please leave a voicemail as directed and we will return your call as soon as possible. Messages left after 4 pm will be answered the following business day.   You may also send us a message via MyChart. We typically respond to MyChart messages within 1-2 business days.  For prescription refills, please ask your pharmacy to contact our office. Our fax number is [phone].  If you have an urgent issue when the clinic is closed that cannot wait until the next business day, you can page your doctor at the number below.    Please note that while we do our best to be available for urgent issues   outside of office hours, we are not available 24/7.   If you have an urgent issue and are unable to reach us, you may choose to seek medical care at your doctor's office, retail clinic, urgent care center, or emergency room.  If you have a medical emergency, please immediately call 911 or go to the emergency department.  Pager Numbers  - Dr. Kowalski: [phone]  - Dr. Moye: [phone]  - Dr. Stewart: [phone]  In the event of inclement weather, please call our main line at [phone] for an update on the status of any delays or closures.  Dermatology Medication Tips: Please  keep the boxes that topical medications come in in order to help keep track of the instructions about where and how to use these. Pharmacies typically print the medication instructions only on the boxes and not directly on the medication tubes.   If your medication is too expensive, please contact our office at [phone] option 4 or send us a message through MyChart.   We are unable to tell what your co-pay for medications will be in advance as this is different depending on your insurance coverage. However, we may be able to find a substitute medication at lower cost or fill out paperwork to get insurance to cover a needed medication.   If a prior authorization is required to get your medication covered by your insurance company, please allow us 1-2 business days to complete this process.  Drug prices often vary depending on where the prescription is filled and some pharmacies may offer cheaper prices.  The website www.goodrx.com contains coupons for medications through different pharmacies. The prices here do not account for what the cost may be with help from insurance (it may be cheaper with your insurance), but the website can give you the price if you did not use any insurance.  - You can print the associated coupon and take it with your prescription to the pharmacy.  - You may also stop by our office during regular business hours and pick up a GoodRx coupon card.  - If you need your prescription sent electronically to a different pharmacy, notify our office through [facility] MyChart or by phone at [phone] option 4.     Si Usted Necesita Algo Despus de Su Visita  Tambin puede enviarnos un mensaje a travs de MyChart. Por lo general respondemos a los mensajes de MyChart en el transcurso de 1 a 2 das hbiles.  Para renovar recetas, por favor pida a su farmacia que se ponga en contacto con nuestra oficina. Nuestro nmero de fax es el [phone].  Si tiene un asunto urgente  cuando la clnica est cerrada y que no puede esperar hasta el siguiente da hbil, puede llamar/localizar a su doctor(a) al nmero que aparece a continuacin.   Por favor, tenga en cuenta que aunque hacemos todo lo posible para estar disponibles para asuntos urgentes fuera del horario de oficina, no estamos disponibles las 24 horas del da, los 7 das de la semana.   Si tiene un problema urgente y no puede comunicarse con nosotros, puede optar por buscar atencin mdica  en el consultorio de su doctor(a), en una clnica privada, en un centro de atencin urgente o en una sala de emergencias.  Si tiene una emergencia mdica, por favor llame inmediatamente al 911 o vaya a la sala de emergencias.  Nmeros de bper  - Dr. Kowalski: [phone]  - Dra. Moye: [phone]  - Dra. Stewart: [phone]  En caso   de inclemencias del tiempo, por favor llame a nuestra lnea principal al [phone] para una actualizacin sobre el estado de cualquier retraso o cierre.  Consejos para la medicacin en dermatologa: Por favor, guarde las cajas en las que vienen los medicamentos de uso tpico para ayudarle a seguir las instrucciones sobre dnde y cmo usarlos. Las farmacias generalmente imprimen las instrucciones del medicamento slo en las cajas y no directamente en los tubos del medicamento.   Si su medicamento es muy caro, por favor, pngase en contacto con nuestra oficina llamando al [phone] y presione la opcin 4 o envenos un mensaje a travs de MyChart.   No podemos decirle cul ser su copago por los medicamentos por adelantado ya que esto es diferente dependiendo de la cobertura de su seguro. Sin embargo, es posible que podamos encontrar un medicamento sustituto a menor costo o llenar un formulario para que el seguro cubra el medicamento que se considera necesario.   Si se requiere una autorizacin previa para que su compaa de seguros cubra su medicamento, por favor permtanos de 1 a 2  das hbiles para completar este proceso.  Los precios de los medicamentos varan con frecuencia dependiendo del lugar de dnde se surte la receta y alguna farmacias pueden ofrecer precios ms baratos.  El sitio web www.goodrx.com tiene cupones para medicamentos de diferentes farmacias. Los precios aqu no tienen en cuenta lo que podra costar con la ayuda del seguro (puede ser ms barato con su seguro), pero el sitio web puede darle el precio si no utiliz ningn seguro.  - Puede imprimir el cupn correspondiente y llevarlo con su receta a la farmacia.  - Tambin puede pasar por nuestra oficina durante el horario de atencin regular y recoger una tarjeta de cupones de GoodRx.  - Si necesita que su receta se enve electrnicamente a una farmacia diferente, informe a nuestra oficina a travs de MyChart de [place] o por telfono llamando al [phone] y presione la opcin 4.  

## 2022-06-05 DIAGNOSIS — M546 Pain in thoracic spine: Secondary | ICD-10-CM | POA: Diagnosis not present

## 2022-06-05 DIAGNOSIS — M9903 Segmental and somatic dysfunction of lumbar region: Secondary | ICD-10-CM | POA: Diagnosis not present

## 2022-06-05 DIAGNOSIS — M5137 Other intervertebral disc degeneration, lumbosacral region: Secondary | ICD-10-CM | POA: Diagnosis not present

## 2022-06-05 DIAGNOSIS — M9905 Segmental and somatic dysfunction of pelvic region: Secondary | ICD-10-CM | POA: Diagnosis not present

## 2022-06-05 DIAGNOSIS — M461 Sacroiliitis, not elsewhere classified: Secondary | ICD-10-CM | POA: Diagnosis not present

## 2022-06-05 DIAGNOSIS — M9902 Segmental and somatic dysfunction of thoracic region: Secondary | ICD-10-CM | POA: Diagnosis not present

## 2022-06-13 ENCOUNTER — Encounter: Payer: Self-pay | Admitting: Dermatology

## 2022-06-14 ENCOUNTER — Ambulatory Visit: Payer: 59 | Admitting: Dermatology

## 2022-07-04 DIAGNOSIS — M9902 Segmental and somatic dysfunction of thoracic region: Secondary | ICD-10-CM | POA: Diagnosis not present

## 2022-07-04 DIAGNOSIS — M9905 Segmental and somatic dysfunction of pelvic region: Secondary | ICD-10-CM | POA: Diagnosis not present

## 2022-07-04 DIAGNOSIS — M9903 Segmental and somatic dysfunction of lumbar region: Secondary | ICD-10-CM | POA: Diagnosis not present

## 2022-07-04 DIAGNOSIS — M461 Sacroiliitis, not elsewhere classified: Secondary | ICD-10-CM | POA: Diagnosis not present

## 2022-07-04 DIAGNOSIS — M546 Pain in thoracic spine: Secondary | ICD-10-CM | POA: Diagnosis not present

## 2022-07-04 DIAGNOSIS — M5137 Other intervertebral disc degeneration, lumbosacral region: Secondary | ICD-10-CM | POA: Diagnosis not present

## 2022-07-18 ENCOUNTER — Ambulatory Visit (INDEPENDENT_AMBULATORY_CARE_PROVIDER_SITE_OTHER): Payer: 59 | Admitting: Family

## 2022-07-18 ENCOUNTER — Other Ambulatory Visit: Payer: Self-pay

## 2022-07-18 ENCOUNTER — Encounter: Payer: Self-pay | Admitting: Family

## 2022-07-18 VITALS — BP 118/70 | HR 87 | Temp 98.0°F | Ht 63.0 in | Wt 186.0 lb

## 2022-07-18 DIAGNOSIS — Z1231 Encounter for screening mammogram for malignant neoplasm of breast: Secondary | ICD-10-CM | POA: Diagnosis not present

## 2022-07-18 DIAGNOSIS — F32A Depression, unspecified: Secondary | ICD-10-CM | POA: Diagnosis not present

## 2022-07-18 DIAGNOSIS — Z13220 Encounter for screening for lipoid disorders: Secondary | ICD-10-CM | POA: Diagnosis not present

## 2022-07-18 DIAGNOSIS — Z8639 Personal history of other endocrine, nutritional and metabolic disease: Secondary | ICD-10-CM

## 2022-07-18 DIAGNOSIS — Z0000 Encounter for general adult medical examination without abnormal findings: Secondary | ICD-10-CM

## 2022-07-18 DIAGNOSIS — Z136 Encounter for screening for cardiovascular disorders: Secondary | ICD-10-CM

## 2022-07-18 MED ORDER — TRAZODONE HCL 50 MG PO TABS
25.0000 mg | ORAL_TABLET | Freq: Every evening | ORAL | 3 refills | Status: DC | PRN
Start: 2022-07-18 — End: 2023-08-28
  Filled 2022-07-18: qty 90, 90d supply, fill #0
  Filled 2023-02-28: qty 90, 90d supply, fill #1

## 2022-07-18 MED ORDER — BUPROPION HCL ER (XL) 150 MG PO TB24
150.00 mg | ORAL_TABLET | Freq: Every day | ORAL | 3 refills | Status: DC
Start: 2022-07-18 — End: 2022-09-03
  Filled 2022-07-18: qty 90, 52d supply, fill #0
  Filled 2022-09-03: qty 90, 52d supply, fill #1

## 2022-07-18 NOTE — Assessment & Plan Note (Signed)
Mammogram ordered and patient will schedule.  Deferred pelvic exam due to patient preference and she is also on her menses.  Patient will return for Pap smear.

## 2022-07-18 NOTE — Patient Instructions (Addendum)
Start Wellbutrin, trazodone as discussed.  Please let me know how you are doing and certainly if I can help in any way.  Please call  and schedule your 3D mammogram and /or bone density scan as we discussed.   [facility]  ( new location in 2023)  [address], [place], [place] [address]  [place], [place]  [phone]  Health Maintenance, Female Adopting a healthy lifestyle and getting preventive care are important in promoting health and wellness. Ask your health care provider about: The right schedule for you to have regular tests and exams. Things you can do on your own to prevent diseases and keep yourself healthy. What should I know about diet, weight, and exercise? Eat a healthy diet  Eat a diet that includes plenty of vegetables, fruits, low-fat dairy products, and lean protein. Do not eat a lot of foods that are high in solid fats, added sugars, or sodium. Maintain a healthy weight Body mass index (BMI) is used to identify weight problems. It estimates body fat based on height and weight. Your health care provider can help determine your BMI and help you achieve or maintain a healthy weight. Get regular exercise Get regular exercise. This is one of the most important things you can do for your health. Most adults should: Exercise for at least 150 minutes each week. The exercise should increase your heart rate and make you sweat (moderate-intensity exercise). Do strengthening exercises at least twice a week. This is in addition to the moderate-intensity exercise. Spend less time sitting. Even light physical activity can be beneficial. Watch cholesterol and blood lipids Have your blood tested for lipids and cholesterol at 47 years of age, then have this test every 5 years. Have your cholesterol levels checked more often if: Your lipid or cholesterol levels are high. You are older than 47 years of age. You are at high risk for heart disease. What should I  know about cancer screening? Depending on your health history and family history, you may need to have cancer screening at various ages. This may include screening for: Breast cancer. Cervical cancer. Colorectal cancer. Skin cancer. Lung cancer. What should I know about heart disease, diabetes, and high blood pressure? Blood pressure and heart disease High blood pressure causes heart disease and increases the risk of stroke. This is more likely to develop in people who have high blood pressure readings or are overweight. Have your blood pressure checked: Every 3-5 years if you are 55-40 years of age. Every year if you are 25 years old or older. Diabetes Have regular diabetes screenings. This checks your fasting blood sugar level. Have the screening done: Once every three years after age 66 if you are at a normal weight and have a low risk for diabetes. More often and at a younger age if you are overweight or have a high risk for diabetes. What should I know about preventing infection? Hepatitis B If you have a higher risk for hepatitis B, you should be screened for this virus. Talk with your health care provider to find out if you are at risk for hepatitis B infection. Hepatitis C Testing is recommended for: Everyone born from [birth year] through 1965. Anyone with known risk factors for hepatitis C. Sexually transmitted infections (STIs) Get screened for STIs, including gonorrhea and chlamydia, if: You are sexually active and are younger than 47 years of age. You are older than 48 years of age and your health care provider tells you  that you are at risk for this type of infection. Your sexual activity has changed since you were last screened, and you are at increased risk for chlamydia or gonorrhea. Ask your health care provider if you are at risk. Ask your health care provider about whether you are at high risk for HIV. Your health care provider may recommend a prescription medicine to help  prevent HIV infection. If you choose to take medicine to prevent HIV, you should first get tested for HIV. You should then be tested every 3 months for as long as you are taking the medicine. Pregnancy If you are about to stop having your period (premenopausal) and you may become pregnant, seek counseling before you get pregnant. Take 400 to 800 micrograms (mcg) of folic acid every day if you become pregnant. Ask for birth control (contraception) if you want to prevent pregnancy. Osteoporosis and menopause Osteoporosis is a disease in which the bones lose minerals and strength with aging. This can result in bone fractures. If you are 56 years old or older, or if you are at risk for osteoporosis and fractures, ask your health care provider if you should: Be screened for bone loss. Take a calcium or vitamin D supplement to lower your risk of fractures. Be given hormone replacement therapy (HRT) to treat symptoms of menopause. Follow these instructions at home: Alcohol use Do not drink alcohol if: Your health care provider tells you not to drink. You are pregnant, may be pregnant, or are planning to become pregnant. If you drink alcohol: Limit how much you have to: 0-1 drink a day. Know how much alcohol is in your drink. In the [place] one drink equals one 12 oz bottle of beer (355 mL), one 5 oz glass of wine (148 mL), or one 1 oz glass of hard liquor (44 mL). Lifestyle Do not use any products that contain nicotine or tobacco. These products include cigarettes, chewing tobacco, and vaping devices, such as e-cigarettes. If you need help quitting, ask your health care provider. Do not use street drugs. Do not share needles. Ask your health care provider for help if you need support or information about quitting drugs. General instructions Schedule regular health, dental, and eye exams. Stay current with your vaccines. Tell your health care provider if: You often feel depressed. You have ever  been abused or do not feel safe at home. Summary Adopting a healthy lifestyle and getting preventive care are important in promoting health and wellness. Follow your health care provider's instructions about healthy diet, exercising, and getting tested or screened for diseases. Follow your health care provider's instructions on monitoring your cholesterol and blood pressure. This information is not intended to replace advice given to you by your health care provider. Make sure you discuss any questions you have with your health care provider. Document Revised: 07/04/2020 Document Reviewed: 07/04/2020 Elsevier Patient Education  2023 [employer].

## 2022-07-18 NOTE — Progress Notes (Signed)
Assessment & Plan:  Annual physical exam Assessment & Plan: Mammogram ordered and patient will schedule.  Deferred pelvic exam due to patient preference and she is also on her menses.  Patient will return for Pap smear.    Orders: -     Hemoglobin A1c -     CBC with Differential/Platelet -     Comprehensive metabolic panel -     Lipid panel -     TSH -     traZODone HCl; Take 0.5-1 tablets (25-50 mg total) by mouth at bedtime as needed for sleep.  Dispense: 90 tablet; Refill: 3 -     buPROPion HCl ER (XL); Take 1 tablet (150 mg total) by mouth daily. After 1-2 weeks, may increase to 2 tablets (300mg ) every morning  Dispense: 90 tablet; Refill: 3  Mild depression Assessment & Plan: Related to caregiver fatigue and caring for her mother who has dementia.  Previously done quite well on Wellbutrin.  Resume Wellbutrin 150 mg and titrate.  Patient will also start trazodone 25 to 50 mg and titrate.  She will let me know how she is doing.  Orders: -     TSH -     traZODone HCl; Take 0.5-1 tablets (25-50 mg total) by mouth at bedtime as needed for sleep.  Dispense: 90 tablet; Refill: 3 -     buPROPion HCl ER (XL); Take 1 tablet (150 mg total) by mouth daily. After 1-2 weeks, may increase to 2 tablets (300mg ) every morning  Dispense: 90 tablet; Refill: 3  Encounter for lipid screening for cardiovascular disease -     Lipid panel  History of vitamin D deficiency -     VITAMIN D 25 Hydroxy (Vit-D Deficiency, Fractures)  Encounter for screening mammogram for malignant neoplasm of breast -     3D Screening Mammogram, Left and Right; Future     Return precautions given.   Risks, benefits, and alternatives of the medications and treatment plan prescribed today were discussed, and patient expressed understanding.   Education regarding symptom management and diagnosis given to patient on AVS either electronically or printed.  No follow-ups on file.  Rennie Plowman, FNP  Subjective:     Patient ID: Christine Petty, female    DOB: [birth year], 47 y.o.   MRN: [id number]  CC: Christine Petty is a 47 y.o. female who presents today for physical exam.    HPI: She has not been feeling quite like herself.  She has felt more tearful, decreased motivation.  She typically works out regularly however lately has not been .   She is caring for mother whom has dementia.  Her brother lives 2 hours away so most of the responsibility is falling on her.  Endorses some stress at work.  She has a wonderful 75 year old son.  She endorses trouble staying asleep  Previously had been on wellbutrin  No history of seizure, anorexia, bulimia  Denies thoughts of hurting herself or anyone else   History of basal cell carcinoma, melanoma.  She follows with dermatology  Colorectal Cancer Screening: UTD , Dr Myrtie Neither , 09/02/20, repeat in 10 years Breast Cancer Screening: Mammogram due Cervical Cancer Screening: UTD, due, 04/01/2019; negative HPV, NIL. She started menses yesterday.          Tetanus - UTD      Exercise: No regular exercise.   Alcohol use:  rare Smoking/tobacco use: Nonsmoker.    Health Maintenance  Topic Date Due   COVID-19 Vaccine (3 -  Pfizer risk series) 04/07/2019   Pap Smear  03/31/2022   Flu Shot  09/27/2022   Colon Cancer Screening  09/03/2030   DTaP/Tdap/Td vaccine (2 - Td or Tdap) 07/13/2031   Hepatitis C Screening: USPSTF Recommendation to screen - Ages 60-79 yo.  Completed   HIV Screening  Completed   HPV Vaccine  Aged Out    ALLERGIES: Patient has no known allergies.  Current Outpatient Medications on File Prior to Visit  Medication Sig Dispense Refill   tretinoin (RETIN-A) 0.025 % cream Apply topically at bedtime. Apply pea sized amount to face 45 g 11   No current facility-administered medications on file prior to visit.    Review of Systems  Constitutional:  Negative for chills, fatigue, fever and unexpected weight change.  HENT:  Negative for congestion.    Respiratory:  Negative for cough.   Cardiovascular:  Negative for chest pain, palpitations and leg swelling.  Gastrointestinal:  Negative for nausea and vomiting.  Musculoskeletal:  Negative for arthralgias and myalgias.  Skin:  Negative for rash.  Neurological:  Negative for headaches.  Hematological:  Negative for adenopathy.  Psychiatric/Behavioral:  Positive for sleep disturbance. Negative for confusion and suicidal ideas. The patient is not nervous/anxious.       Objective:    BP 118/70   Pulse 87   Temp 98 F (36.7 C) (Oral)   Ht 5\' 3"  (1.6 m)   Wt 186 lb (84.4 kg)   SpO2 97%   BMI 32.95 kg/m   BP Readings from Last 3 Encounters:  07/18/22 118/70  06/04/22 112/74  05/13/22 126/81   Wt Readings from Last 3 Encounters:  07/18/22 186 lb (84.4 kg)  05/13/22 170 lb (77.1 kg)  07/12/21 174 lb 6.4 oz (79.1 kg)    Physical Exam Vitals reviewed.  Constitutional:      Appearance: Normal appearance. She is well-developed.  Eyes:     Conjunctiva/sclera: Conjunctivae normal.  Neck:     Thyroid: No thyroid mass or thyromegaly.  Cardiovascular:     Rate and Rhythm: Normal rate and regular rhythm.     Pulses: Normal pulses.     Heart sounds: Normal heart sounds.  Pulmonary:     Effort: Pulmonary effort is normal.     Breath sounds: Normal breath sounds. No wheezing, rhonchi or rales.  Chest:  Breasts:    Breasts are symmetrical.     Right: No inverted nipple, mass, nipple discharge, skin change or tenderness.     Left: No inverted nipple, mass, nipple discharge, skin change or tenderness.  Abdominal:     General: Bowel sounds are normal. There is no distension.     Palpations: Abdomen is soft. Abdomen is not rigid. There is no fluid wave or mass.     Tenderness: There is no abdominal tenderness. There is no guarding or rebound.  Lymphadenopathy:     Head:     Right side of head: No submental, submandibular, tonsillar, preauricular, posterior auricular or occipital  adenopathy.     Left side of head: No submental, submandibular, tonsillar, preauricular, posterior auricular or occipital adenopathy.     Cervical: No cervical adenopathy.     Right cervical: No superficial, deep or posterior cervical adenopathy.    Left cervical: No superficial, deep or posterior cervical adenopathy.  Skin:    General: Skin is warm and dry.  Neurological:     Mental Status: She is alert.  Psychiatric:        Speech: Speech normal.  Behavior: Behavior normal.        Thought Content: Thought content normal.

## 2022-07-18 NOTE — Assessment & Plan Note (Signed)
Related to caregiver fatigue and caring for her mother who has dementia.  Previously done quite well on Wellbutrin.  Resume Wellbutrin 150 mg and titrate.  Patient will also start trazodone 25 to 50 mg and titrate.  She will let me know how she is doing.

## 2022-07-19 LAB — CBC WITH DIFFERENTIAL/PLATELET
Basophils Absolute: 0.1 10*3/uL (ref 0.0–0.1)
Basophils Relative: 0.9 % (ref 0.0–3.0)
Eosinophils Absolute: 0 10*3/uL (ref 0.0–0.7)
Eosinophils Relative: 0.6 % (ref 0.0–5.0)
HCT: 38.9 % (ref 36.0–46.0)
Hemoglobin: 13.1 g/dL (ref 12.0–15.0)
Lymphocytes Relative: 30.1 % (ref 12.0–46.0)
Lymphs Abs: 2.4 10*3/uL (ref 0.7–4.0)
MCHC: 33.6 g/dL (ref 30.0–36.0)
MCV: 94.2 fl (ref 78.0–100.0)
Monocytes Absolute: 0.4 10*3/uL (ref 0.1–1.0)
Monocytes Relative: 5.7 % (ref 3.0–12.0)
Neutro Abs: 4.9 10*3/uL (ref 1.4–7.7)
Neutrophils Relative %: 62.7 % (ref 43.0–77.0)
Platelets: 360 10*3/uL (ref 150.0–400.0)
RBC: 4.13 Mil/uL (ref 3.87–5.11)
RDW: 12.5 % (ref 11.5–15.5)
WBC: 7.8 10*3/uL (ref 4.0–10.5)

## 2022-07-19 LAB — COMPREHENSIVE METABOLIC PANEL
ALT: 12 U/L (ref 0–35)
AST: 17 U/L (ref 0–37)
Albumin: 4.3 g/dL (ref 3.5–5.2)
Alkaline Phosphatase: 57 U/L (ref 39–117)
BUN: 11 mg/dL (ref 6–23)
CO2: 27 mEq/L (ref 19–32)
Calcium: 9.4 mg/dL (ref 8.4–10.5)
Chloride: 102 mEq/L (ref 96–112)
Creatinine, Ser: 0.7 mg/dL (ref 0.40–1.20)
GFR: 103.16 mL/min (ref >60.00)
Glucose, Bld: 83 mg/dL (ref 70–99)
Potassium: 3.9 mEq/L (ref 3.5–5.1)
Sodium: 138 mEq/L (ref 135–145)
Total Bilirubin: 1.1 mg/dL (ref 0.2–1.2)
Total Protein: 7.1 g/dL (ref 6.0–8.3)

## 2022-07-19 LAB — HEMOGLOBIN A1C: Hgb A1c MFr Bld: 5.3 % (ref 4.6–6.5)

## 2022-07-19 LAB — LIPID PANEL
Cholesterol: 197 mg/dL (ref 0–200)
HDL: 48.5 mg/dL (ref >39.00)
LDL Cholesterol: 128 mg/dL — ABNORMAL HIGH (ref 0–99)
NonHDL: 148.38
Total CHOL/HDL Ratio: 4
Triglycerides: 100 mg/dL (ref 0.0–149.0)
VLDL: 20 mg/dL (ref 0.0–40.0)

## 2022-07-19 LAB — VITAMIN D 25 HYDROXY (VIT D DEFICIENCY, FRACTURES): VITD: 15.62 ng/mL — ABNORMAL LOW (ref 30.00–100.00)

## 2022-07-19 LAB — TSH: TSH: 1.45 u[IU]/mL (ref 0.35–5.50)

## 2022-07-20 ENCOUNTER — Other Ambulatory Visit: Payer: Self-pay

## 2022-07-20 ENCOUNTER — Other Ambulatory Visit: Payer: Self-pay | Admitting: Family

## 2022-07-20 DIAGNOSIS — Z8639 Personal history of other endocrine, nutritional and metabolic disease: Secondary | ICD-10-CM

## 2022-07-20 MED ORDER — CHOLECALCIFEROL 1.25 MG (50000 UT) PO CAPS
50000.0000 [IU] | ORAL_CAPSULE | ORAL | 0 refills | Status: DC
Start: 2022-07-20 — End: 2023-11-06
  Filled 2022-07-20: qty 8, 56d supply, fill #0

## 2022-07-25 ENCOUNTER — Ambulatory Visit (INDEPENDENT_AMBULATORY_CARE_PROVIDER_SITE_OTHER): Payer: Self-pay | Admitting: Plastic Surgery

## 2022-07-25 DIAGNOSIS — Z719 Counseling, unspecified: Secondary | ICD-10-CM

## 2022-07-25 NOTE — Progress Notes (Signed)

## 2022-07-31 ENCOUNTER — Encounter: Payer: Commercial Managed Care - PPO | Admitting: Plastic Surgery

## 2022-08-01 DIAGNOSIS — M9903 Segmental and somatic dysfunction of lumbar region: Secondary | ICD-10-CM | POA: Diagnosis not present

## 2022-08-01 DIAGNOSIS — M9905 Segmental and somatic dysfunction of pelvic region: Secondary | ICD-10-CM | POA: Diagnosis not present

## 2022-08-01 DIAGNOSIS — M461 Sacroiliitis, not elsewhere classified: Secondary | ICD-10-CM | POA: Diagnosis not present

## 2022-08-01 DIAGNOSIS — M546 Pain in thoracic spine: Secondary | ICD-10-CM | POA: Diagnosis not present

## 2022-08-01 DIAGNOSIS — M9902 Segmental and somatic dysfunction of thoracic region: Secondary | ICD-10-CM | POA: Diagnosis not present

## 2022-08-01 DIAGNOSIS — M5137 Other intervertebral disc degeneration, lumbosacral region: Secondary | ICD-10-CM | POA: Diagnosis not present

## 2022-08-17 ENCOUNTER — Ambulatory Visit
Admission: RE | Admit: 2022-08-17 | Discharge: 2022-08-17 | Disposition: A | Discharge status: Home / Self Care | Payer: 59 | Source: Ambulatory Visit | Attending: Family | Admitting: Family

## 2022-08-17 DIAGNOSIS — Z1231 Encounter for screening mammogram for malignant neoplasm of breast: Secondary | ICD-10-CM

## 2022-09-03 ENCOUNTER — Ambulatory Visit (INDEPENDENT_AMBULATORY_CARE_PROVIDER_SITE_OTHER): Payer: 59 | Admitting: Family

## 2022-09-03 ENCOUNTER — Other Ambulatory Visit (HOSPITAL_COMMUNITY)
Admission: RE | Admit: 2022-09-03 | Discharge: 2022-09-03 | Disposition: A | Discharge status: Home / Self Care | Payer: 59 | Source: Ambulatory Visit | Attending: Family | Admitting: Family

## 2022-09-03 ENCOUNTER — Encounter: Payer: Self-pay | Admitting: Family

## 2022-09-03 ENCOUNTER — Other Ambulatory Visit: Payer: Self-pay

## 2022-09-03 VITALS — BP 114/72 | HR 80 | Temp 97.8°F | Resp 16 | Ht 63.0 in | Wt 173.2 lb

## 2022-09-03 DIAGNOSIS — Z124 Encounter for screening for malignant neoplasm of cervix: Secondary | ICD-10-CM | POA: Diagnosis not present

## 2022-09-03 DIAGNOSIS — F32A Depression, unspecified: Secondary | ICD-10-CM | POA: Diagnosis not present

## 2022-09-03 MED ORDER — BUPROPION HCL ER (XL) 300 MG PO TB24
300.0000 mg | ORAL_TABLET | Freq: Every morning | ORAL | 3 refills | Status: DC
Start: 2022-09-03 — End: 2023-08-28
  Filled 2022-09-03: qty 90, 90d supply, fill #0
  Filled 2022-11-29: qty 90, 90d supply, fill #1
  Filled 2023-02-28: qty 90, 90d supply, fill #2
  Filled 2023-06-02: qty 90, 90d supply, fill #3

## 2022-09-03 NOTE — Assessment & Plan Note (Signed)
Chronic, stable.  Continue Wellbutrin 100 mg daily, trazodone 25 mg every evening as needed.

## 2022-09-03 NOTE — Progress Notes (Signed)
Assessment & Plan:  Screening for cervical cancer -     Cytology - PAP  Mild depression Assessment & Plan: Chronic, stable.  Continue Wellbutrin 100 mg daily, trazodone 25 mg every evening as needed.  Orders: -     buPROPion HCl ER (XL); Take 1 tablet (300 mg total) by mouth every morning.  Dispense: 90 tablet; Refill: 3     Return precautions given.   Risks, benefits, and alternatives of the medications and treatment plan prescribed today were discussed, and patient expressed understanding.   Education regarding symptom management and diagnosis given to patient on AVS either electronically or printed.  Return in about 1 year (around 09/03/2023) for Complete Physical Exam.  Rennie Plowman, FNP  Subjective:    Patient ID: Christine Petty, female    DOB: May 15, 1975, 47 y.o.   MRN: [id number]  CC: Christine Petty is a 47 y.o. female who presents today for follow up.   HPI: She has returned for pap smear as not obtained at her physical.     She resumed wellbutrin 300mg , trazodone 25mg  qpm prn.  She is feeling much better.  Denies increased anxiety, trouble sleeping on Wellbutrin.  Allergies: Patient has no known allergies. Current Outpatient Medications on File Prior to Visit  Medication Sig Dispense Refill   Cholecalciferol 1.25 MG (50000 UT) capsule Take 1 capsule (50,000 Units total) by mouth once a week for 8 weeks. 8 capsule 0   traZODone (DESYREL) 50 MG tablet Take 0.5-1 tablets (25-50 mg total) by mouth at bedtime as needed for sleep. 90 tablet 3   tretinoin (RETIN-A) 0.025 % cream Apply topically at bedtime. Apply pea sized amount to face 45 g 11   No current facility-administered medications on file prior to visit.    Review of Systems  Constitutional:  Negative for chills and fever.  Respiratory:  Negative for cough.   Cardiovascular:  Negative for chest pain and palpitations.  Gastrointestinal:  Negative for nausea and vomiting.      Objective:    BP 114/72    Pulse 80   Temp 97.8 F (36.6 C)   Resp 16   Ht 5\' 3"  (1.6 m)   Wt 173 lb 4 oz (78.6 kg)   LMP 08/17/2022   SpO2 98%   BMI 30.69 kg/m  BP Readings from Last 3 Encounters:  09/03/22 114/72  07/18/22 118/70  06/04/22 112/74   Wt Readings from Last 3 Encounters:  09/03/22 173 lb 4 oz (78.6 kg)  07/18/22 186 lb (84.4 kg)  05/13/22 170 lb (77.1 kg)    Physical Exam Vitals reviewed.  Constitutional:      Appearance: She is well-developed.  Eyes:     Conjunctiva/sclera: Conjunctivae normal.  Cardiovascular:     Rate and Rhythm: Normal rate and regular rhythm.     Pulses: Normal pulses.     Heart sounds: Normal heart sounds.  Pulmonary:     Effort: Pulmonary effort is normal.     Breath sounds: Normal breath sounds. No wheezing, rhonchi or rales.  Genitourinary:    Labia:        Right: No rash or tenderness.        Left: No rash or tenderness.      Cervix: No cervical motion tenderness.     Uterus: Not deviated and not enlarged.      Adnexa:        Right: No mass.         Left: No  mass.       Comments: Pap obtained. No adnexal pain with bi manuel exam.  Skin:    General: Skin is warm and dry.  Neurological:     Mental Status: She is alert.  Psychiatric:        Speech: Speech normal.        Behavior: Behavior normal.        Thought Content: Thought content normal.

## 2022-09-04 ENCOUNTER — Other Ambulatory Visit: Payer: Self-pay

## 2022-09-04 DIAGNOSIS — M9903 Segmental and somatic dysfunction of lumbar region: Secondary | ICD-10-CM | POA: Diagnosis not present

## 2022-09-04 DIAGNOSIS — M9902 Segmental and somatic dysfunction of thoracic region: Secondary | ICD-10-CM | POA: Diagnosis not present

## 2022-09-04 DIAGNOSIS — M546 Pain in thoracic spine: Secondary | ICD-10-CM | POA: Diagnosis not present

## 2022-09-04 DIAGNOSIS — M5137 Other intervertebral disc degeneration, lumbosacral region: Secondary | ICD-10-CM | POA: Diagnosis not present

## 2022-09-04 DIAGNOSIS — M461 Sacroiliitis, not elsewhere classified: Secondary | ICD-10-CM | POA: Diagnosis not present

## 2022-09-04 DIAGNOSIS — M9905 Segmental and somatic dysfunction of pelvic region: Secondary | ICD-10-CM | POA: Diagnosis not present

## 2022-09-06 LAB — CYTOLOGY - PAP
Adequacy: ABSENT
Chlamydia: NEGATIVE
Comment: NEGATIVE
Comment: NEGATIVE
Comment: NEGATIVE
Comment: NEGATIVE
Comment: NORMAL
Diagnosis: NEGATIVE
HSV1: NEGATIVE
HSV2: NEGATIVE
High risk HPV: NEGATIVE
Neisseria Gonorrhea: NEGATIVE
Trichomonas: NEGATIVE

## 2022-09-07 ENCOUNTER — Other Ambulatory Visit (HOSPITAL_COMMUNITY): Payer: Self-pay

## 2022-10-10 DIAGNOSIS — M9902 Segmental and somatic dysfunction of thoracic region: Secondary | ICD-10-CM | POA: Diagnosis not present

## 2022-10-10 DIAGNOSIS — M9905 Segmental and somatic dysfunction of pelvic region: Secondary | ICD-10-CM | POA: Diagnosis not present

## 2022-10-10 DIAGNOSIS — M461 Sacroiliitis, not elsewhere classified: Secondary | ICD-10-CM | POA: Diagnosis not present

## 2022-10-10 DIAGNOSIS — M546 Pain in thoracic spine: Secondary | ICD-10-CM | POA: Diagnosis not present

## 2022-10-10 DIAGNOSIS — M5137 Other intervertebral disc degeneration, lumbosacral region: Secondary | ICD-10-CM | POA: Diagnosis not present

## 2022-10-10 DIAGNOSIS — M9903 Segmental and somatic dysfunction of lumbar region: Secondary | ICD-10-CM | POA: Diagnosis not present

## 2022-11-14 DIAGNOSIS — M5137 Other intervertebral disc degeneration, lumbosacral region: Secondary | ICD-10-CM | POA: Diagnosis not present

## 2022-11-14 DIAGNOSIS — M9905 Segmental and somatic dysfunction of pelvic region: Secondary | ICD-10-CM | POA: Diagnosis not present

## 2022-11-14 DIAGNOSIS — M546 Pain in thoracic spine: Secondary | ICD-10-CM | POA: Diagnosis not present

## 2022-11-14 DIAGNOSIS — M9902 Segmental and somatic dysfunction of thoracic region: Secondary | ICD-10-CM | POA: Diagnosis not present

## 2022-11-14 DIAGNOSIS — M9903 Segmental and somatic dysfunction of lumbar region: Secondary | ICD-10-CM | POA: Diagnosis not present

## 2022-11-14 DIAGNOSIS — M461 Sacroiliitis, not elsewhere classified: Secondary | ICD-10-CM | POA: Diagnosis not present

## 2022-12-12 ENCOUNTER — Ambulatory Visit: Payer: 59 | Admitting: Dermatology

## 2022-12-12 DIAGNOSIS — L821 Other seborrheic keratosis: Secondary | ICD-10-CM

## 2022-12-12 DIAGNOSIS — Z86018 Personal history of other benign neoplasm: Secondary | ICD-10-CM

## 2022-12-12 DIAGNOSIS — Z85828 Personal history of other malignant neoplasm of skin: Secondary | ICD-10-CM

## 2022-12-12 DIAGNOSIS — L570 Actinic keratosis: Secondary | ICD-10-CM

## 2022-12-12 DIAGNOSIS — D225 Melanocytic nevi of trunk: Secondary | ICD-10-CM | POA: Diagnosis not present

## 2022-12-12 DIAGNOSIS — D492 Neoplasm of unspecified behavior of bone, soft tissue, and skin: Secondary | ICD-10-CM | POA: Diagnosis not present

## 2022-12-12 DIAGNOSIS — D2271 Melanocytic nevi of right lower limb, including hip: Secondary | ICD-10-CM

## 2022-12-12 DIAGNOSIS — L578 Other skin changes due to chronic exposure to nonionizing radiation: Secondary | ICD-10-CM

## 2022-12-12 DIAGNOSIS — D229 Melanocytic nevi, unspecified: Secondary | ICD-10-CM

## 2022-12-12 DIAGNOSIS — L814 Other melanin hyperpigmentation: Secondary | ICD-10-CM | POA: Diagnosis not present

## 2022-12-12 DIAGNOSIS — D489 Neoplasm of uncertain behavior, unspecified: Secondary | ICD-10-CM

## 2022-12-12 DIAGNOSIS — Z85820 Personal history of malignant melanoma of skin: Secondary | ICD-10-CM | POA: Diagnosis not present

## 2022-12-12 DIAGNOSIS — W908XXA Exposure to other nonionizing radiation, initial encounter: Secondary | ICD-10-CM

## 2022-12-12 DIAGNOSIS — Z1283 Encounter for screening for malignant neoplasm of skin: Secondary | ICD-10-CM

## 2022-12-12 NOTE — Patient Instructions (Addendum)
Biopsy Wound Care Instructions  Leave the original bandage on for 24 hours if possible.  If the bandage becomes soaked or soiled before that time, it is OK to remove it and examine the wound.  A small amount of post-operative bleeding is normal.  If excessive bleeding occurs, remove the bandage, place gauze over the site and apply continuous pressure (no peeking) over the area for 30 minutes. If this does not work, please call our clinic as soon as possible or page your doctor if it is after hours.   Once a day, cleanse the wound with soap and water. It is fine to shower. If a thick crust develops you may use a Q-tip dipped into dilute hydrogen peroxide (mix 1:1 with water) to dissolve it.  Hydrogen peroxide can slow the healing process, so use it only as needed.    After washing, apply petroleum jelly (Vaseline) or an antibiotic ointment if your doctor prescribed one for you, followed by a bandage.    For best healing, the wound should be covered with a layer of ointment at all times. If you are not able to keep the area covered with a bandage to hold the ointment in place, this may mean re-applying the ointment several times a day.  Continue this wound care until the wound has healed and is no longer open.   Itching and mild discomfort is normal during the healing process. However, if you develop pain or severe itching, please call our office.   If you have any discomfort, you can take Tylenol (acetaminophen) or ibuprofen as directed on the bottle. (Please do not take these if you have an allergy to them or cannot take them for another reason).  Some redness, tenderness and white or yellow material in the wound is normal healing.  If the area becomes very sore and red, or develops a thick yellow-green material (pus), it may be infected; please notify us.    If you have stitches, return to clinic as directed to have the stitches removed. You will continue wound care for 2-3 days after the stitches  are removed.   Wound healing continues for up to one year following surgery. It is not unusual to experience pain in the scar from time to time during the interval.  If the pain becomes severe or the scar thickens, you should notify the office.    A slight amount of redness in a scar is expected for the first six months.  After six months, the redness will fade and the scar will soften and fade.  The color difference becomes less noticeable with time.  If there are any problems, return for a post-op surgery check at your earliest convenience.  To improve the appearance of the scar, you can use silicone scar gel, cream, or sheets (such as Mederma or Serica) every night for up to one year. These are available over the counter (without a prescription).  Please call our office at [phone] for any questions or concerns.       Actinic keratoses are precancerous spots that appear secondary to cumulative UV radiation exposure/sun exposure over time. They are chronic with expected duration over 1 year. A portion of actinic keratoses will progress to squamous cell carcinoma of the skin. It is not possible to reliably predict which spots will progress to skin cancer and so treatment is recommended to prevent development of skin cancer.  Recommend daily broad spectrum sunscreen SPF 30+ to sun-exposed areas, reapply every 2 hours  as needed.  Recommend staying in the shade or wearing long sleeves, sun glasses (UVA+UVB protection) and wide brim hats (4-inch brim around the entire circumference of the hat). Call for new or changing lesions.    Cryotherapy Aftercare  Wash gently with soap and water everyday.   Apply Vaseline and Band-Aid daily until healed.     Melanoma ABCDEs  Melanoma is the most dangerous type of skin cancer, and is the leading cause of death from skin disease.  You are more likely to develop melanoma if you: Have light-colored skin, light-colored eyes, or red or blond  hair Spend a lot of time in the sun Tan regularly, either outdoors or in a tanning bed Have had blistering sunburns, especially during childhood Have a close family member who has had a melanoma Have atypical moles or large birthmarks  Early detection of melanoma is key since treatment is typically straightforward and cure rates are extremely high if we catch it early.   The first sign of melanoma is often a change in a mole or a new dark spot.  The ABCDE system is a way of remembering the signs of melanoma.  A for asymmetry:  The two halves do not match. B for border:  The edges of the growth are irregular. C for color:  A mixture of colors are present instead of an even brown color. D for diameter:  Melanomas are usually (but not always) greater than 6mm - the size of a pencil eraser. E for evolution:  The spot keeps changing in size, shape, and color.  Please check your skin once per month between visits. You can use a small mirror in front and a large mirror behind you to keep an eye on the back side or your body.   If you see any new or changing lesions before your next follow-up, please call to schedule a visit.  Please continue daily skin protection including broad spectrum sunscreen SPF 30+ to sun-exposed areas, reapplying every 2 hours as needed when you're outdoors.   Staying in the shade or wearing long sleeves, sun glasses (UVA+UVB protection) and wide brim hats (4-inch brim around the entire circumference of the hat) are also recommended for sun protection.    Due to recent changes in healthcare laws, you may see results of your pathology and/or laboratory studies on MyChart before the doctors have had a chance to review them. We understand that in some cases there may be results that are confusing or concerning to you. Please understand that not all results are received at the same time and often the doctors may need to interpret multiple results in order to provide you with  the best plan of care or course of treatment. Therefore, we ask that you please give [place] 2 business days to thoroughly review all your results before contacting the office for clarification. Should we see a critical lab result, you will be contacted sooner.   If You Need Anything After Your Visit  If you have any questions or concerns for your doctor, please call our main line at [phone] and press option 4 to reach your doctor's medical assistant. If no one answers, please leave a voicemail as directed and we will return your call as soon as possible. Messages left after 4 pm will be answered the following business day.   You may also send [place] a message via MyChart. We typically respond to MyChart messages within 1-2 business days.  For prescription refills, please ask  your pharmacy to contact our office. Our fax number is [phone].  If you have an urgent issue when the clinic is closed that cannot wait until the next business day, you can page your doctor at the number below.    Please note that while we do our best to be available for urgent issues outside of office hours, we are not available 24/7.   If you have an urgent issue and are unable to reach [place], you may choose to seek medical care at your doctor's office, retail clinic, urgent care center, or emergency room.  If you have a medical emergency, please immediately call 911 or go to the emergency department.  Pager Numbers  - Dr. Gwen Pounds: [phone]  - Dr. Roseanne Reno: [phone]  - Dr. Katrinka Blazing: [phone]   In the event of inclement weather, please call our main line at [phone] for an update on the status of any delays or closures.  Dermatology Medication Tips: Please keep the boxes that topical medications come in in order to help keep track of the instructions about where and how to use these. Pharmacies typically print the medication instructions only on the boxes and not directly on the medication tubes.   If  your medication is too expensive, please contact our office at [phone] option 4 or send [place] a message through MyChart.   We are unable to tell what your co-pay for medications will be in advance as this is different depending on your insurance coverage. However, we may be able to find a substitute medication at lower cost or fill out paperwork to get insurance to cover a needed medication.   If a prior authorization is required to get your medication covered by your insurance company, please allow [place] 1-2 business days to complete this process.  Drug prices often vary depending on where the prescription is filled and some pharmacies may offer cheaper prices.  The website www.goodrx.com contains coupons for medications through different pharmacies. The prices here do not account for what the cost may be with help from insurance (it may be cheaper with your insurance), but the website can give you the price if you did not use any insurance.  - You can print the associated coupon and take it with your prescription to the pharmacy.  - You may also stop by our office during regular business hours and pick up a GoodRx coupon card.  - If you need your prescription sent electronically to a different pharmacy, notify our office through [facility] or by phone at [phone] option 4.     Si Usted Necesita Algo Despus de Su Visita  Tambin puede enviarnos un mensaje a travs de Clinical cytogeneticist. Por lo general respondemos a los mensajes de MyChart en el transcurso de 1 a 2 das hbiles.  Para renovar recetas, por favor pida a su farmacia que se ponga en contacto con nuestra oficina. Annie Sable de fax es [place] [phone].  Si tiene un asunto urgente cuando la clnica est cerrada y que no puede esperar hasta el siguiente da hbil, puede llamar/localizar a su doctor(a) al nmero que aparece a continuacin.   Por favor, tenga en cuenta que aunque hacemos todo lo posible para estar disponibles para  asuntos urgentes fuera del horario de [place], no estamos disponibles las 24 horas del da, los 7 [address]  [address] de la [place].   Si tiene un problema urgente y no puede comunicarse con nosotros, puede optar por buscar atencin Sports administrator de su  doctor(a), en una clnica privada, en un centro de atencin urgente o en una sala de emergencias.  Si tiene Engineer, drilling, por favor llame inmediatamente al 911 o vaya a la sala de emergencias.  Nmeros de bper  - Dr. Gwen Pounds: [phone]  - Dra. Roseanne Reno: [phone]  - Dr. Katrinka Blazing: [phone]   En caso de inclemencias del tiempo, por favor llame a Lacy Duverney principal al [phone] para una actualizacin sobre el [place] de cualquier retraso o cierre.  Consejos para la medicacin en dermatologa: Por favor, guarde las cajas en las que vienen los medicamentos de uso tpico para ayudarle a seguir las instrucciones sobre dnde y cmo usarlos. Las farmacias generalmente imprimen las instrucciones del medicamento slo en las cajas y no directamente en los tubos del [place].   Si su medicamento es muy caro, por favor, pngase en contacto con Rolm Gala llamando al [phone] y presione la opcin 4 o envenos un mensaje a travs de Clinical cytogeneticist.   No podemos decirle cul ser su copago por los medicamentos por adelantado ya que esto es diferente dependiendo de la cobertura de su seguro. Sin embargo, es posible que podamos encontrar un medicamento sustituto a Audiological scientist un formulario para que el seguro cubra el medicamento que se considera necesario.   Si se requiere una autorizacin previa para que su compaa de seguros [place] su medicamento, por favor permtanos de 1 a 2 das hbiles para completar [address]  Los precios de los medicamentos varan con frecuencia dependiendo del Environmental consultant de dnde se surte la receta y alguna farmacias pueden ofrecer precios ms baratos.  El sitio web www.goodrx.com tiene cupones para  medicamentos de Health and safety inspector. Los precios aqu no tienen en cuenta lo que podra costar con la ayuda del seguro (puede ser ms barato con su seguro), pero el sitio web puede darle el precio si no utiliz Tourist information centre manager.  - Puede imprimir el cupn correspondiente y llevarlo con su receta a la farmacia.  - Tambin puede pasar por nuestra oficina durante el horario de atencin regular y Education officer, museum una tarjeta de cupones de GoodRx.  - Si necesita que su receta se enve electrnicamente a una farmacia diferente, informe a nuestra oficina a travs de MyChart de [place] o por telfono llamando al [phone] y presione la opcin 4.

## 2022-12-12 NOTE — Progress Notes (Signed)
Follow-Up Visit   Subjective  Christine Petty is a 47 y.o. female who presents for the following: Skin Cancer Screening and Full Body Skin Exam Patient noticed scaly spot at forehead.   The patient presents for Total-Body Skin Exam (TBSE) for skin cancer screening and mole check. The patient has spots, moles and lesions to be evaluated, some may be new or changing and the patient may have concern these could be cancer.   The following portions of the chart were reviewed this encounter and updated as appropriate: medications, allergies, medical history  Review of Systems:  No other skin or systemic complaints except as noted in HPI or Assessment and Plan.  Objective  Well appearing patient in no apparent distress; mood and affect are within normal limits.  A full examination was performed including scalp, head, eyes, ears, nose, lips, neck, chest, axillae, abdomen, back, buttocks, bilateral upper extremities, bilateral lower extremities, hands, feet, fingers, toes, fingernails, and toenails. All findings within normal limits unless otherwise noted below.   Relevant physical exam findings are noted in the Assessment and Plan.        left superior forehead x 1 Erythematous thin papules/macules with gritty scale.   right mid back 2 cm lateral to spine 0.6 cm irregular dark brown macule          Assessment & Plan   SKIN CANCER SCREENING PERFORMED TODAY.  ACTINIC DAMAGE - Chronic condition, secondary to cumulative UV/sun exposure - diffuse scaly erythematous macules with underlying dyspigmentation - Recommend daily broad spectrum sunscreen SPF 30+ to sun-exposed areas, reapply every 2 hours as needed.  - Staying in the shade or wearing long sleeves, sun glasses (UVA+UVB protection) and wide brim hats (4-inch brim around the entire circumference of the hat) are also recommended for sun protection.  - Call for new or changing lesions.  LENTIGINES, SEBORRHEIC KERATOSES,  HEMANGIOMAS - Benign normal skin lesions - Benign-appearing - Call for any changes  MELANOCYTIC NEVI - Tan-brown and/or pink-flesh-colored symmetric macules and papules - Benign appearing on exam today - Observation - Call clinic for new or changing moles - Recommend daily use of broad spectrum spf 30+ sunscreen to sun-exposed areas.   Nevus Right lateral ankle  Photo today  0.3 brown macule  Benign Observe.    HISTORY OF BASAL CELL CARCINOMA OF THE SKIN Multiple sites see history  - No evidence of recurrence today - Recommend regular full body skin exams - Recommend daily broad spectrum sunscreen SPF 30+ to sun-exposed areas, reapply every 2 hours as needed.  - Call if any new or changing lesions are noted between office visits   HISTORY OF MELANOMA R lat neck WLE 2010, neg sentinel node  Clark's level IV, Breslow's 0.93mm  - No evidence of recurrence today - No lymphadenopathy - Recommend regular full body skin exams - Recommend daily broad spectrum sunscreen SPF 30+ to sun-exposed areas, reapply every 2 hours as needed.  - Call if any new or changing lesions are noted between office visits   HISTORY OF DYSPLASTIC NEVUS Multiple sites see history No evidence of recurrence today Recommend regular full body skin exams Recommend daily broad spectrum sunscreen SPF 30+ to sun-exposed areas, reapply every 2 hours as needed.  Call if any new or changing lesions are noted between office visits    Actinic keratosis left superior forehead x 1  Actinic keratoses are precancerous spots that appear secondary to cumulative UV radiation exposure/sun exposure over time. They are chronic with  expected duration over 1 year. A portion of actinic keratoses will progress to squamous cell carcinoma of the skin. It is not possible to reliably predict which spots will progress to skin cancer and so treatment is recommended to prevent development of skin cancer.  Recommend daily broad  spectrum sunscreen SPF 30+ to sun-exposed areas, reapply every 2 hours as needed.  Recommend staying in the shade or wearing long sleeves, sun glasses (UVA+UVB protection) and wide brim hats (4-inch brim around the entire circumference of the hat). Call for new or changing lesions.  Destruction of lesion - left superior forehead x 1 Complexity: simple   Destruction method: cryotherapy   Informed consent: discussed and consent obtained   Timeout:  patient name, date of birth, surgical site, and procedure verified Lesion destroyed using liquid nitrogen: Yes   Region frozen until ice ball extended beyond lesion: Yes   Outcome: patient tolerated procedure well with no complications   Post-procedure details: wound care instructions given    Neoplasm of uncertain behavior right mid back 2 cm lateral to spine  Epidermal / dermal shaving  Lesion diameter (cm):  0.6 Informed consent: discussed and consent obtained   Timeout: patient name, date of birth, surgical site, and procedure verified   Procedure prep:  Patient was prepped and draped in usual sterile fashion Prep type:  Isopropyl alcohol Anesthesia: the lesion was anesthetized in a standard fashion   Anesthetic:  1% lidocaine w/ epinephrine 1-100,000 buffered w/ 8.4% NaHCO3 Instrument used: flexible razor blade   Hemostasis achieved with: pressure, aluminum chloride and electrodesiccation   Outcome: patient tolerated procedure well   Post-procedure details: sterile dressing applied and wound care instructions given   Dressing type: bandage and petrolatum    Specimen 1 - Surgical pathology Differential Diagnosis: R/o irritated nevus vs dysplastic nevus   Check Margins: No  R/o irritated nevus vs dysplastia    Return for 7 month tbse .  IAsher Muir, CMA, am acting as scribe for Armida Sans, MD.   Documentation: I have reviewed the above documentation for accuracy and completeness, and I agree with the above.  Armida Sans, MD

## 2022-12-14 LAB — SURGICAL PATHOLOGY

## 2022-12-18 ENCOUNTER — Telehealth: Payer: Self-pay

## 2022-12-18 NOTE — Telephone Encounter (Signed)
-----   Message from Armida Sans sent at 12/18/2022 11:31 AM EDT ----- FINAL DIAGNOSIS        1. 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

## 2022-12-18 NOTE — Telephone Encounter (Signed)
Advised pt of bx results and to keep f/u appointment.

## 2022-12-25 ENCOUNTER — Encounter: Payer: Self-pay | Admitting: Dermatology

## 2023-01-09 DIAGNOSIS — M9903 Segmental and somatic dysfunction of lumbar region: Secondary | ICD-10-CM | POA: Diagnosis not present

## 2023-01-09 DIAGNOSIS — M9905 Segmental and somatic dysfunction of pelvic region: Secondary | ICD-10-CM | POA: Diagnosis not present

## 2023-01-09 DIAGNOSIS — M9902 Segmental and somatic dysfunction of thoracic region: Secondary | ICD-10-CM | POA: Diagnosis not present

## 2023-01-09 DIAGNOSIS — M5137 Other intervertebral disc degeneration, lumbosacral region with discogenic back pain only: Secondary | ICD-10-CM | POA: Diagnosis not present

## 2023-01-09 DIAGNOSIS — M546 Pain in thoracic spine: Secondary | ICD-10-CM | POA: Diagnosis not present

## 2023-01-09 DIAGNOSIS — M461 Sacroiliitis, not elsewhere classified: Secondary | ICD-10-CM | POA: Diagnosis not present

## 2023-01-22 ENCOUNTER — Telehealth: Payer: 59 | Admitting: Family Medicine

## 2023-01-22 DIAGNOSIS — L089 Local infection of the skin and subcutaneous tissue, unspecified: Secondary | ICD-10-CM

## 2023-01-22 DIAGNOSIS — T148XXA Other injury of unspecified body region, initial encounter: Secondary | ICD-10-CM | POA: Diagnosis not present

## 2023-01-22 MED ORDER — MUPIROCIN 2 % EX OINT
1.0000 | TOPICAL_OINTMENT | Freq: Two times a day (BID) | CUTANEOUS | 0 refills | Status: DC
Start: 2023-01-22 — End: 2023-11-06

## 2023-01-22 MED ORDER — SULFAMETHOXAZOLE-TRIMETHOPRIM 800-160 MG PO TABS
1.0000 | ORAL_TABLET | Freq: Two times a day (BID) | ORAL | 0 refills | Status: AC
Start: 2023-01-26 — End: 2023-02-02

## 2023-01-22 NOTE — Progress Notes (Signed)
Virtual Visit Consent   Christine Petty, you are scheduled for a virtual visit with a [facility] provider today. Just as with appointments in the office, your consent must be obtained to participate. Your consent will be active for this visit and any virtual visit you may have with one of our providers in the next 365 days. If you have a MyChart account, a copy of this consent can be sent to you electronically.  As this is a virtual visit, video technology does not allow for your provider to perform a traditional examination. This may limit your provider's ability to fully assess your condition. If your provider identifies any concerns that need to be evaluated in person or the need to arrange testing (such as labs, EKG, etc.), we will make arrangements to do so. Although advances in technology are sophisticated, we cannot ensure that it will always work on either your end or our end. If the connection with a video visit is poor, the visit may have to be switched to a telephone visit. With either a video or telephone visit, we are not always able to ensure that we have a secure connection.  By engaging in this virtual visit, you consent to the provision of healthcare and authorize for your insurance to be billed (if applicable) for the services provided during this visit. Depending on your insurance coverage, you may receive a charge related to this service.  I need to obtain your verbal consent now. Are you willing to proceed with your visit today? Christine Petty has provided verbal consent on 01/22/2023 for a virtual visit (video or telephone). Freddy Finner, NP  Date: 01/22/2023 8:32 AM  Virtual Visit via Video Note   I, Freddy Finner, connected with  Christine Petty  ([id number], 03-Jun-1975) on 01/22/23 at  8:30 AM EST by a video-enabled telemedicine application and verified that I am speaking with the correct person using two identifiers.  Location: Patient: Virtual Visit Location Patient:  Home Provider: Virtual Visit Location Provider: Home Office   I discussed the limitations of evaluation and management by telemedicine and the availability of in person appointments. The patient expressed understanding and agreed to proceed.    History of Present Illness: Christine Petty is a 47 y.o. who identifies as a female who was assigned female at birth, and is being seen today for blister that popped.  Location- Between the pinky toe and third toe of left foot. Thought it was athletes foot, used OTC spray no help. Noted on Sat looked liked a blister and opened up. Has become painful and some redness now. Used OTC triple antibiotic ointment without much relief. Not overtly warm or much spreading of redness. No active drainage, Fevers, chills or trouble walking on foot.   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   Annual physical exam 09/28/2014   Encounter for counseling 09/28/2014   History of melanoma 09/28/2014    Allergies: No Known Allergies Medications:  Current Outpatient Medications:    buPROPion (WELLBUTRIN XL) 300 MG 24 hr tablet, Take 1 tablet (300 mg total) by mouth every morning., Disp: 90 tablet, Rfl: 3   Cholecalciferol 1.25 MG (50000 UT) capsule, Take 1 capsule (50,000 Units total) by mouth once a week for 8 weeks., Disp: 8 capsule, Rfl: 0   traZODone (DESYREL) 50 MG  tablet, Take 0.5-1 tablets (25-50 mg total) by mouth at bedtime as needed for sleep., Disp: 90 tablet, Rfl: 3  Observations/Objective: Patient is well-developed, well-nourished in no acute distress.  Resting comfortably  at home.  Head is normocephalic, atraumatic.  No labored breathing.  Speech is clear and coherent with logical content.  Patient is alert and oriented at baseline.  Between the pinky and third toe there is a noted open blister, no  drainage, redness at area and on lateral aspect of toe. No swelling. Movement intact   Assessment and Plan:  1. Blistered skin  - mupirocin ointment (BACTROBAN) 2 %; Apply 1 Application topically 2 (two) times daily.  Dispense: 22 g; Refill: 0  2. Skin infection  - mupirocin ointment (BACTROBAN) 2 %; Apply 1 Application topically 2 (two) times daily.  Dispense: 22 g; Refill: 0 - sulfamethoxazole-trimethoprim (BACTRIM DS) 800-160 MG tablet; Take 1 tablet by mouth 2 (two) times daily for 7 days.  Dispense: 14 tablet; Refill: 0   Given location and higher risk of infection- will start mupirocin daily with gauze and open to air as much as possible.  If not improved- delayed Rx for ABX in case Staph or MRSA infection- does work in healthcare setting. Also given it is holiday time. Signs and precautions reviewed in detail on in person assessment vs starting ABX a week out.  Reviewed side effects, risks and benefits of medication.    Patient acknowledged agreement and understanding of the plan.   Past Medical, Surgical, Social History, Allergies, and Medications have been Reviewed.    Follow Up Instructions: I discussed the assessment and treatment plan with the patient. The patient was provided an opportunity to ask questions and all were answered. The patient agreed with the plan and demonstrated an understanding of the instructions.  A copy of instructions were sent to the patient via MyChart unless otherwise noted below.    The patient was advised to call back or seek an in-person evaluation if the symptoms worsen or if the condition fails to improve as anticipated.    Freddy Finner, NP

## 2023-01-22 NOTE — Patient Instructions (Addendum)
  Lawrence Santiago, thank you for joining Freddy Finner, NP for today's virtual visit.  While this provider is not your primary care provider (PCP), if your PCP is located in our provider database this encounter information will be shared with them immediately following your visit.   A [facility] MyChart account gives you access to today's visit and all your visits, tests, and labs performed at [facility] " click here if you don't have a [facility] MyChart account or go to [email or web address]  Consent: (Patient) Christine Petty provided verbal consent for this virtual visit at the beginning of the encounter.  Current Medications:  Current Outpatient Medications:    mupirocin ointment (BACTROBAN) 2 %, Apply 1 Application topically 2 (two) times daily., Disp: 22 g, Rfl: 0   [START ON 01/26/2023] sulfamethoxazole-trimethoprim (BACTRIM DS) 800-160 MG tablet, Take 1 tablet by mouth 2 (two) times daily for 7 days., Disp: 14 tablet, Rfl: 0   buPROPion (WELLBUTRIN XL) 300 MG 24 hr tablet, Take 1 tablet (300 mg total) by mouth every morning., Disp: 90 tablet, Rfl: 3   Cholecalciferol 1.25 MG (50000 UT) capsule, Take 1 capsule (50,000 Units total) by mouth once a week for 8 weeks., Disp: 8 capsule, Rfl: 0   traZODone (DESYREL) 50 MG tablet, Take 0.5-1 tablets (25-50 mg total) by mouth at bedtime as needed for sleep., Disp: 90 tablet, Rfl: 3   Medications ordered in this encounter:  Meds ordered this encounter  Medications   mupirocin ointment (BACTROBAN) 2 %    Sig: Apply 1 Application topically 2 (two) times daily.    Dispense:  22 g    Refill:  0    Order Specific Question:   Supervising Provider    Answer:   Merrilee Jansky [[id number]]   sulfamethoxazole-trimethoprim (BACTRIM DS) 800-160 MG tablet    Sig: Take 1 tablet by mouth 2 (two) times daily for 7 days.    Dispense:  14 tablet    Refill:  0    Order Specific Question:   Supervising Provider    Answer:   Merrilee Jansky  [id number]     *If you need refills on other medications prior to your next appointment, please contact your pharmacy*  Follow-Up: Call back or seek an in-person evaluation if the symptoms worsen or if the condition fails to improve as anticipated.  [facility] [phone]  Other Instructions Given location and higher risk of infection- will start mupirocin daily with gauze and open to air as much as possible.  If not improved- delayed Rx for ABX in case Staph or MRSA infection- does work in healthcare setting. Signs and precautions reviewed in detail on in person assessment vs starting ABX a week out.   If you have been instructed to have an in-person evaluation today at a local Urgent Care facility, please use the link below. It will take you to a list of all of our available [facility], including address, phone number and hours of operation. Please do not delay care.  [facility]  If you or a family member do not have a primary care provider, use the link below to schedule a visit and establish care. When you choose a [facility] primary care physician or advanced practice provider, you gain a long-term partner in health. Find a Primary Care Provider  Learn more about [facility]'s in-office and virtual care options: [facility] - Get Care Now

## 2023-03-01 ENCOUNTER — Other Ambulatory Visit: Payer: Self-pay

## 2023-03-01 ENCOUNTER — Other Ambulatory Visit: Payer: Self-pay | Admitting: Dermatology

## 2023-03-01 DIAGNOSIS — L988 Other specified disorders of the skin and subcutaneous tissue: Secondary | ICD-10-CM

## 2023-03-03 ENCOUNTER — Other Ambulatory Visit: Payer: Self-pay

## 2023-03-03 ENCOUNTER — Other Ambulatory Visit: Payer: Self-pay | Admitting: Dermatology

## 2023-03-03 DIAGNOSIS — L988 Other specified disorders of the skin and subcutaneous tissue: Secondary | ICD-10-CM

## 2023-03-04 ENCOUNTER — Other Ambulatory Visit: Payer: Self-pay

## 2023-03-05 ENCOUNTER — Telehealth: Payer: Self-pay | Admitting: Family

## 2023-03-05 NOTE — Telephone Encounter (Signed)
 Was informed that FMLA paperwork has been faxed and a copy will be left up front for pick up.

## 2023-03-27 DIAGNOSIS — M461 Sacroiliitis, not elsewhere classified: Secondary | ICD-10-CM | POA: Diagnosis not present

## 2023-03-27 DIAGNOSIS — M9902 Segmental and somatic dysfunction of thoracic region: Secondary | ICD-10-CM | POA: Diagnosis not present

## 2023-03-27 DIAGNOSIS — M9905 Segmental and somatic dysfunction of pelvic region: Secondary | ICD-10-CM | POA: Diagnosis not present

## 2023-03-27 DIAGNOSIS — M5137 Other intervertebral disc degeneration, lumbosacral region with discogenic back pain only: Secondary | ICD-10-CM | POA: Diagnosis not present

## 2023-03-27 DIAGNOSIS — M546 Pain in thoracic spine: Secondary | ICD-10-CM | POA: Diagnosis not present

## 2023-03-27 DIAGNOSIS — M9903 Segmental and somatic dysfunction of lumbar region: Secondary | ICD-10-CM | POA: Diagnosis not present

## 2023-05-01 DIAGNOSIS — M461 Sacroiliitis, not elsewhere classified: Secondary | ICD-10-CM | POA: Diagnosis not present

## 2023-05-01 DIAGNOSIS — M5137 Other intervertebral disc degeneration, lumbosacral region with discogenic back pain only: Secondary | ICD-10-CM | POA: Diagnosis not present

## 2023-05-01 DIAGNOSIS — M9902 Segmental and somatic dysfunction of thoracic region: Secondary | ICD-10-CM | POA: Diagnosis not present

## 2023-05-01 DIAGNOSIS — M9905 Segmental and somatic dysfunction of pelvic region: Secondary | ICD-10-CM | POA: Diagnosis not present

## 2023-05-01 DIAGNOSIS — M9903 Segmental and somatic dysfunction of lumbar region: Secondary | ICD-10-CM | POA: Diagnosis not present

## 2023-05-01 DIAGNOSIS — M546 Pain in thoracic spine: Secondary | ICD-10-CM | POA: Diagnosis not present

## 2023-06-05 DIAGNOSIS — M9905 Segmental and somatic dysfunction of pelvic region: Secondary | ICD-10-CM | POA: Diagnosis not present

## 2023-06-05 DIAGNOSIS — M461 Sacroiliitis, not elsewhere classified: Secondary | ICD-10-CM | POA: Diagnosis not present

## 2023-06-05 DIAGNOSIS — M546 Pain in thoracic spine: Secondary | ICD-10-CM | POA: Diagnosis not present

## 2023-06-05 DIAGNOSIS — M5137 Other intervertebral disc degeneration, lumbosacral region with discogenic back pain only: Secondary | ICD-10-CM | POA: Diagnosis not present

## 2023-06-05 DIAGNOSIS — M9903 Segmental and somatic dysfunction of lumbar region: Secondary | ICD-10-CM | POA: Diagnosis not present

## 2023-06-05 DIAGNOSIS — M9902 Segmental and somatic dysfunction of thoracic region: Secondary | ICD-10-CM | POA: Diagnosis not present

## 2023-07-04 ENCOUNTER — Ambulatory Visit: Admitting: Dermatology

## 2023-07-09 DIAGNOSIS — M546 Pain in thoracic spine: Secondary | ICD-10-CM | POA: Diagnosis not present

## 2023-07-09 DIAGNOSIS — M9905 Segmental and somatic dysfunction of pelvic region: Secondary | ICD-10-CM | POA: Diagnosis not present

## 2023-07-09 DIAGNOSIS — M9902 Segmental and somatic dysfunction of thoracic region: Secondary | ICD-10-CM | POA: Diagnosis not present

## 2023-07-09 DIAGNOSIS — M9903 Segmental and somatic dysfunction of lumbar region: Secondary | ICD-10-CM | POA: Diagnosis not present

## 2023-07-09 DIAGNOSIS — M5137 Other intervertebral disc degeneration, lumbosacral region with discogenic back pain only: Secondary | ICD-10-CM | POA: Diagnosis not present

## 2023-07-09 DIAGNOSIS — M461 Sacroiliitis, not elsewhere classified: Secondary | ICD-10-CM | POA: Diagnosis not present

## 2023-07-17 ENCOUNTER — Ambulatory Visit: Payer: 59 | Admitting: Dermatology

## 2023-08-07 ENCOUNTER — Encounter: Payer: Self-pay | Admitting: Family

## 2023-08-07 ENCOUNTER — Telehealth: Payer: Self-pay

## 2023-08-07 NOTE — Telephone Encounter (Signed)
 I left voicemail requesting a call back.  I also sent a letter to patient via MyChart.  E2C2 - when patient calls back, please reschedule her 10/17/2023 visit with Bascom Bossier, FNP, as provider will be out of the office.

## 2023-08-13 DIAGNOSIS — M9905 Segmental and somatic dysfunction of pelvic region: Secondary | ICD-10-CM | POA: Diagnosis not present

## 2023-08-13 DIAGNOSIS — M9903 Segmental and somatic dysfunction of lumbar region: Secondary | ICD-10-CM | POA: Diagnosis not present

## 2023-08-13 DIAGNOSIS — M546 Pain in thoracic spine: Secondary | ICD-10-CM | POA: Diagnosis not present

## 2023-08-13 DIAGNOSIS — M461 Sacroiliitis, not elsewhere classified: Secondary | ICD-10-CM | POA: Diagnosis not present

## 2023-08-13 DIAGNOSIS — M9902 Segmental and somatic dysfunction of thoracic region: Secondary | ICD-10-CM | POA: Diagnosis not present

## 2023-08-13 DIAGNOSIS — M5137 Other intervertebral disc degeneration, lumbosacral region with discogenic back pain only: Secondary | ICD-10-CM | POA: Diagnosis not present

## 2023-08-28 ENCOUNTER — Other Ambulatory Visit: Payer: Self-pay | Admitting: Family

## 2023-08-28 ENCOUNTER — Other Ambulatory Visit: Payer: Self-pay

## 2023-08-28 DIAGNOSIS — Z0000 Encounter for general adult medical examination without abnormal findings: Secondary | ICD-10-CM

## 2023-08-28 DIAGNOSIS — Z1231 Encounter for screening mammogram for malignant neoplasm of breast: Secondary | ICD-10-CM

## 2023-08-28 DIAGNOSIS — F32A Depression, unspecified: Secondary | ICD-10-CM

## 2023-08-28 MED ORDER — BUPROPION HCL ER (XL) 300 MG PO TB24
300.0000 mg | ORAL_TABLET | Freq: Every morning | ORAL | 3 refills | Status: AC
  Filled 2023-08-28: qty 90, 90d supply, fill #0
  Filled 2023-12-04: qty 90, 90d supply, fill #1
  Filled 2024-02-23: qty 90, 90d supply, fill #2

## 2023-08-28 MED ORDER — TRAZODONE HCL 50 MG PO TABS
25.0000 mg | ORAL_TABLET | Freq: Every evening | ORAL | 3 refills | Status: AC | PRN
  Filled 2023-08-28: qty 90, 90d supply, fill #0

## 2023-09-09 ENCOUNTER — Ambulatory Visit: Admitting: Dermatology

## 2023-09-09 ENCOUNTER — Encounter: Payer: Self-pay | Admitting: Dermatology

## 2023-09-09 ENCOUNTER — Other Ambulatory Visit: Payer: Self-pay

## 2023-09-09 DIAGNOSIS — Z86018 Personal history of other benign neoplasm: Secondary | ICD-10-CM

## 2023-09-09 DIAGNOSIS — L579 Skin changes due to chronic exposure to nonionizing radiation, unspecified: Secondary | ICD-10-CM | POA: Diagnosis not present

## 2023-09-09 DIAGNOSIS — Z7189 Other specified counseling: Secondary | ICD-10-CM

## 2023-09-09 DIAGNOSIS — D2271 Melanocytic nevi of right lower limb, including hip: Secondary | ICD-10-CM

## 2023-09-09 DIAGNOSIS — L570 Actinic keratosis: Secondary | ICD-10-CM

## 2023-09-09 DIAGNOSIS — Z5111 Encounter for antineoplastic chemotherapy: Secondary | ICD-10-CM

## 2023-09-09 DIAGNOSIS — Z85828 Personal history of other malignant neoplasm of skin: Secondary | ICD-10-CM

## 2023-09-09 DIAGNOSIS — W908XXA Exposure to other nonionizing radiation, initial encounter: Secondary | ICD-10-CM

## 2023-09-09 DIAGNOSIS — D229 Melanocytic nevi, unspecified: Secondary | ICD-10-CM

## 2023-09-09 DIAGNOSIS — L578 Other skin changes due to chronic exposure to nonionizing radiation: Secondary | ICD-10-CM | POA: Diagnosis not present

## 2023-09-09 DIAGNOSIS — L988 Other specified disorders of the skin and subcutaneous tissue: Secondary | ICD-10-CM | POA: Diagnosis not present

## 2023-09-09 DIAGNOSIS — L821 Other seborrheic keratosis: Secondary | ICD-10-CM

## 2023-09-09 DIAGNOSIS — Z79899 Other long term (current) drug therapy: Secondary | ICD-10-CM

## 2023-09-09 DIAGNOSIS — Z85820 Personal history of malignant melanoma of skin: Secondary | ICD-10-CM

## 2023-09-09 DIAGNOSIS — Z1283 Encounter for screening for malignant neoplasm of skin: Secondary | ICD-10-CM

## 2023-09-09 DIAGNOSIS — L814 Other melanin hyperpigmentation: Secondary | ICD-10-CM | POA: Diagnosis not present

## 2023-09-09 MED ORDER — FLUOROURACIL 5 % EX CREA
TOPICAL_CREAM | Freq: Two times a day (BID) | CUTANEOUS | 2 refills | Status: AC
Start: 2023-09-09 — End: ?

## 2023-09-09 MED ORDER — TRETINOIN 0.025 % EX CREA
TOPICAL_CREAM | CUTANEOUS | 11 refills | Status: AC
  Filled 2023-09-09: qty 45, 30d supply, fill #0
  Filled 2023-12-04: qty 45, 30d supply, fill #1
  Filled 2024-02-23: qty 45, 30d supply, fill #2

## 2023-09-09 NOTE — Progress Notes (Signed)
 Follow-Up Visit   Subjective  Christine Petty is a 48 y.o. female who presents for the following: Skin Cancer Screening and Full Body Skin Exam, hx of Melanoma, hx of Dysplastic nevus, hx of BCC  The patient presents for Total-Body Skin Exam (TBSE) for skin cancer screening and mole check. The patient has spots, moles and lesions to be evaluated, some may be new or changing and the patient may have concern these could be cancer.  The following portions of the chart were reviewed this encounter and updated as appropriate: medications, allergies, medical history  Review of Systems:  No other skin or systemic complaints except as noted in HPI or Assessment and Plan.  Objective  Well appearing patient in no apparent distress; mood and affect are within normal limits.  A full examination was performed including scalp, head, eyes, ears, nose, lips, neck, chest, axillae, abdomen, back, buttocks, bilateral upper extremities, bilateral lower extremities, hands, feet, fingers, toes, fingernails, and toenails. All findings within normal limits unless otherwise noted below.   Relevant physical exam findings are noted in the Assessment and Plan.  left superior forehead x 1 Erythematous thin papules/macules with gritty scale.      Assessment & Plan   SKIN CANCER SCREENING PERFORMED TODAY.  LENTIGINES, SEBORRHEIC KERATOSES, HEMANGIOMAS - Benign normal skin lesions - Benign-appearing - Call for any changes  MELANOCYTIC NEVI Right distal post calf  0.3 x 0.4 cm brown macule- no changes per patient  Photo taken  The patient will observe these symptoms, and report promptly any worsening or unexpected persistence.  If well, may return as scheduled.   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 Only apply a pea-sized amount to the entire affected area. Avoid applying it around the eyes, edges of mouth and creases at the nose. If you experience irritation, use a good moisturizer first and/or apply the medicine less often. If you are doing well with the medicine, you can increase how often you use it until you are applying every night. Be careful with sun protection while using this medication as it can make you sensitive to the sun. This medicine should not be used by pregnant women.    HISTORY OF BASAL CELL CARCINOMA OF THE SKIN Multiple sites see history  - No evidence of recurrence today - Recommend regular full body skin exams - Recommend daily broad spectrum sunscreen SPF 30+ to sun-exposed areas, reapply every 2 hours as needed.  - Call if any new or changing lesions are noted between office visits   HISTORY OF MELANOMA R lat neck WLE 2010, neg sentinel node  Clark's level IV, Breslow's 0.52mm  - No evidence of recurrence today - No lymphadenopathy - Recommend regular full body skin exams - Recommend daily broad spectrum sunscreen SPF 30+ to sun-exposed areas, reapply every 2 hours as needed.  - Call if any new or changing lesions are noted between office visits   HISTORY OF DYSPLASTIC NEVUS Multiple sites see history No evidence of recurrence today Recommend regular full body skin exams Recommend daily broad spectrum sunscreen SPF 30+ to sun-exposed areas, reapply every 2 hours as needed.  Call if any new or changing lesions are noted between office visits    AK (ACTINIC KERATOSIS) left superior forehead x 1 ACTINIC DAMAGE WITH PRECANCEROUS ACTINIC KERATOSES Counseling for Topical  Chemotherapy Management: Patient exhibits: - Severe, confluent actinic changes with pre-cancerous actinic keratoses that is secondary to cumulative UV radiation exposure over time - Condition that is severe; chronic, not at goal. - diffuse scaly erythematous  macules and papules with underlying dyspigmentation - Discussed Prescription Field Treatment topical Chemotherapy for Severe, Chronic Confluent Actinic Changes with Pre-Cancerous Actinic Keratoses Field treatment involves treatment of an entire area of skin that has confluent Actinic Changes (Sun/ Ultraviolet light damage) and PreCancerous Actinic Keratoses by method of PhotoDynamic Therapy (PDT) and/or prescription Topical Chemotherapy agents such as 5-fluorouracil , 5-fluorouracil /calcipotriene, and/or imiquimod.  The purpose is to decrease the number of clinically evident and subclinical PreCancerous lesions to prevent progression to development of skin cancer by chemically destroying early precancer changes that may or may not be visible.  It has been shown to reduce the risk of developing skin cancer in the treated area. As a result of treatment, redness, scaling, crusting, and open sores may occur during treatment course. One or more than one of these methods may be used and may have to be used several times to control, suppress and eliminate the PreCancerous changes. Discussed treatment course, expected reaction, and possible side effects. - Recommend daily broad spectrum sunscreen SPF 30+ to sun-exposed areas, reapply every 2 hours as needed.  - Staying in the shade or wearing long sleeves, sun glasses (UVA+UVB protection) and wide brim hats (4-inch brim around the entire circumference of the hat) are also recommended. - Call for new or changing lesions.   Start 5FU/Calcipotriene cream apply to forehead twice a day for 7 days  Destruction of lesion - left superior forehead x 1 Complexity: simple   Destruction method: cryotherapy   Informed consent: discussed and consent obtained   Timeout:  patient name, date of birth, surgical site, and procedure verified Lesion destroyed using liquid nitrogen: Yes   Region frozen until ice ball extended beyond lesion: Yes   Outcome: patient tolerated  procedure well with no complications   Post-procedure details: wound care instructions given      Return in about 6 months (around 03/11/2024) for TBSE, hx of Melanoma, hx of BCC, hx of Dysplastic nevus .  IFay Kirks, CMA, am acting as scribe for Alm Rhyme, MD .   Documentation: I have reviewed the above documentation for accuracy and completeness, and I agree with the above.  Alm Rhyme, MD

## 2023-09-09 NOTE — Patient Instructions (Addendum)
Instructions for [facility] Medications  One or more of your medications was sent to the [facility] mail order compounding pharmacy. You will receive an email from them and can purchase the medicine through that link. It will then be mailed to your home at the address you confirmed. If for any reason you do not receive an email from them, please check your spam folder. If you still do not find the email, please let us know. [facility] phone number is [phone].    5-Fluorouracil/Calcipotriene Patient Education   Actinic keratoses are the dry, red scaly spots on the skin caused by sun damage. A portion of these spots can turn into skin cancer with time, and treating them can help prevent development of skin cancer.   Treatment of these spots requires removal of the defective skin cells. There are various ways to remove actinic keratoses, including freezing with liquid nitrogen, treatment with creams, or treatment with a blue light procedure in the office.   5-fluorouracil cream is a topical cream used to treat actinic keratoses. It works by interfering with the growth of abnormal fast-growing skin cells, such as actinic keratoses. These cells peel off and are replaced by healthy ones.   5-fluorouracil/calcipotriene is a combination of the 5-fluorouracil cream with a vitamin D analog cream called calcipotriene. The calcipotriene alone does not treat actinic keratoses. However, when it is combined with 5-fluorouracil, it helps the 5-fluorouracil treat the actinic keratoses much faster so that the same results can be achieved with a much shorter treatment time.  INSTRUCTIONS FOR 5-FLUOROURACIL/CALCIPOTRIENE CREAM:   5-fluorouracil/calcipotriene cream typically only needs to be used for 4-7 days. A thin layer should be applied twice a day to the treatment areas recommended by your physician.   If your physician prescribed you separate tubes of 5-fluourouracil and calcipotriene, apply  a thin layer of 5-fluorouracil followed by a thin layer of calcipotriene.   Avoid contact with your eyes, nostrils, and mouth. Do not use 5-fluorouracil/calcipotriene cream on infected or open wounds.   You will develop redness, irritation and some crusting at areas where you have pre-cancer damage/actinic keratoses. IF YOU DEVELOP PAIN, BLEEDING, OR SIGNIFICANT CRUSTING, STOP THE TREATMENT EARLY - you have already gotten a good response and the actinic keratoses should clear up well.  Wash your hands after applying 5-fluorouracil 5% cream on your skin.   A moisturizer or sunscreen with a minimum SPF 30 should be applied each morning.   Once you have finished the treatment, you can apply a thin layer of Vaseline twice a day to irritated areas to soothe and calm the areas more quickly. If you experience significant discomfort, contact your physician.  For some patients it is necessary to repeat the treatment for best results.  SIDE EFFECTS: When using 5-fluorouracil/calcipotriene cream, you may have mild irritation, such as redness, dryness, swelling, or a mild burning sensation. This usually resolves within 2 weeks. The more actinic keratoses you have, the more redness and inflammation you can expect during treatment. Eye irritation has been reported rarely. If this occurs, please let us know.  If you have any trouble using this cream, please call the office. If you have any other questions about this information, please do not hesitate to ask me before you leave the office.       Cryotherapy Aftercare  Wash gently with soap and water everyday.   Apply Vaseline and Band-Aid daily until healed.     Due to recent changes in healthcare laws,  you may see results of your pathology and/or laboratory studies on MyChart before the doctors have had a chance to review them. We understand that in some cases there may be results that are confusing or concerning to you. Please understand that not all  results are received at the same time and often the doctors may need to interpret multiple results in order to provide you with the best plan of care or course of treatment. Therefore, we ask that you please give [place] 2 business days to thoroughly review all your results before contacting the office for clarification. Should we see a critical lab result, you will be contacted sooner.   If You Need Anything After Your Visit  If you have any questions or concerns for your doctor, please call our main line at [phone] and press option 4 to reach your doctor's medical assistant. If no one answers, please leave a voicemail as directed and we will return your call as soon as possible. Messages left after 4 pm will be answered the following business day.   You may also send [place] a message via MyChart. We typically respond to MyChart messages within 1-2 business days.  For prescription refills, please ask your pharmacy to contact our office. Our fax number is [phone].  If you have an urgent issue when the clinic is closed that cannot wait until the next business day, you can page your doctor at the number below.    Please note that while we do our best to be available for urgent issues outside of office hours, we are not available 24/7.   If you have an urgent issue and are unable to reach [place], you may choose to seek medical care at your doctor's office, retail clinic, urgent care center, or emergency room.  If you have a medical emergency, please immediately call 911 or go to the emergency department.  Pager Numbers  - Dr. Gwen Pounds: [phone]  - Dr. Roseanne Reno: [phone]  - Dr. Katrinka Blazing: [phone]   In the event of inclement weather, please call our main line at [phone] for an update on the status of any delays or closures.  Dermatology Medication Tips: Please keep the boxes that topical medications come in in order to help keep track of the instructions about where and how to use  these. Pharmacies typically print the medication instructions only on the boxes and not directly on the medication tubes.   If your medication is too expensive, please contact our office at [phone] option 4 or send [place] a message through MyChart.   We are unable to tell what your co-pay for medications will be in advance as this is different depending on your insurance coverage. However, we may be able to find a substitute medication at lower cost or fill out paperwork to get insurance to cover a needed medication.   If a prior authorization is required to get your medication covered by your insurance company, please allow [place] 1-2 business days to complete this process.  Drug prices often vary depending on where the prescription is filled and some pharmacies may offer cheaper prices.  The website www.goodrx.com contains coupons for medications through different pharmacies. The prices here do not account for what the cost may be with help from insurance (it may be cheaper with your insurance), but the website can give you the price if you did not use any insurance.  - You can print the associated coupon and take it with your prescription to the pharmacy.  -  You may also stop by our office during regular business hours and pick up a GoodRx coupon card.  - If you need your prescription sent electronically to a different pharmacy, notify our office through [facility] or by phone at [phone] option 4.     Si Usted Necesita Algo Despus de Su Visita  Tambin puede enviarnos un mensaje a travs de Clinical cytogeneticist. Por lo general respondemos a los mensajes de MyChart en el transcurso de 1 a 2 das hbiles.  Para renovar recetas, por favor pida a su farmacia que se ponga en contacto con nuestra oficina. Annie Sable de fax es [place] [phone].  Si tiene un asunto urgente cuando la clnica est cerrada y que no puede esperar hasta el siguiente da hbil, puede llamar/localizar a su doctor(a) al  nmero que aparece a continuacin.   Por favor, tenga en cuenta que aunque hacemos todo lo posible para estar disponibles para asuntos urgentes fuera del horario de [place], no estamos disponibles las 24 horas del da, los 7 [address]  [address] de la [place].   Si tiene un problema urgente y no puede comunicarse con nosotros, puede optar por buscar atencin mdica  en el consultorio de su doctor(a), en una clnica privada, en un centro de atencin urgente o en una sala de emergencias.  Si tiene Engineer, drilling, por favor llame inmediatamente al 911 o vaya a la sala de emergencias.  Nmeros de bper  - Dr. Gwen Pounds: [phone]  - Dra. Roseanne Reno: [phone]  - Dr. Katrinka Blazing: [phone]   En caso de inclemencias del tiempo, por favor llame a Lacy Duverney principal al [phone] para una actualizacin sobre el [place] de cualquier retraso o cierre.  Consejos para la medicacin en dermatologa: Por favor, guarde las cajas en las que vienen los medicamentos de uso tpico para ayudarle a seguir las instrucciones sobre dnde y cmo usarlos. Las farmacias generalmente imprimen las instrucciones del medicamento slo en las cajas y no directamente en los tubos del [place].   Si su medicamento es muy caro, por favor, pngase en contacto con Rolm Gala llamando al [phone] y presione la opcin 4 o envenos un mensaje a travs de Clinical cytogeneticist.   No podemos decirle cul ser su copago por los medicamentos por adelantado ya que esto es diferente dependiendo de la cobertura de su seguro. Sin embargo, es posible que podamos encontrar un medicamento sustituto a Audiological scientist un formulario para que el seguro cubra el medicamento que se considera necesario.   Si se requiere una autorizacin previa para que su compaa de seguros [place] su medicamento, por favor permtanos de 1 a 2 das hbiles para completar [address]  Los precios de los medicamentos varan con frecuencia dependiendo del Environmental consultant de  dnde se surte la receta y alguna farmacias pueden ofrecer precios ms baratos.  El sitio web www.goodrx.com tiene cupones para medicamentos de Health and safety inspector. Los precios aqu no tienen en cuenta lo que podra costar con la ayuda del seguro (puede ser ms barato con su seguro), pero el sitio web puede darle el precio si no utiliz Tourist information centre manager.  - Puede imprimir el cupn correspondiente y llevarlo con su receta a la farmacia.  - Tambin puede pasar por nuestra oficina durante el horario de atencin regular y Education officer, museum una tarjeta de cupones de GoodRx.  - Si necesita que su receta se enve electrnicamente a Psychiatrist, informe a nuestra oficina a travs de MyChart de [place] o por telfono llamando al [phone]  y presione la opcin 4.

## 2023-09-17 DIAGNOSIS — M9902 Segmental and somatic dysfunction of thoracic region: Secondary | ICD-10-CM | POA: Diagnosis not present

## 2023-09-17 DIAGNOSIS — M546 Pain in thoracic spine: Secondary | ICD-10-CM | POA: Diagnosis not present

## 2023-09-17 DIAGNOSIS — M461 Sacroiliitis, not elsewhere classified: Secondary | ICD-10-CM | POA: Diagnosis not present

## 2023-09-17 DIAGNOSIS — M5137 Other intervertebral disc degeneration, lumbosacral region with discogenic back pain only: Secondary | ICD-10-CM | POA: Diagnosis not present

## 2023-09-17 DIAGNOSIS — M9903 Segmental and somatic dysfunction of lumbar region: Secondary | ICD-10-CM | POA: Diagnosis not present

## 2023-09-17 DIAGNOSIS — M9905 Segmental and somatic dysfunction of pelvic region: Secondary | ICD-10-CM | POA: Diagnosis not present

## 2023-09-24 ENCOUNTER — Ambulatory Visit
Admission: RE | Admit: 2023-09-24 | Discharge: 2023-09-24 | Disposition: A | Discharge status: Home / Self Care | Source: Ambulatory Visit | Attending: Family | Admitting: Family

## 2023-09-24 DIAGNOSIS — Z1231 Encounter for screening mammogram for malignant neoplasm of breast: Secondary | ICD-10-CM | POA: Diagnosis not present

## 2023-10-17 ENCOUNTER — Encounter: Admitting: Family

## 2023-11-06 ENCOUNTER — Other Ambulatory Visit (HOSPITAL_COMMUNITY)
Admission: RE | Admit: 2023-11-06 | Discharge: 2023-11-06 | Disposition: A | Discharge status: Home / Self Care | Source: Ambulatory Visit | Attending: Family | Admitting: Family

## 2023-11-06 ENCOUNTER — Encounter: Payer: Self-pay | Admitting: Family

## 2023-11-06 ENCOUNTER — Ambulatory Visit: Admitting: Family

## 2023-11-06 VITALS — BP 94/60 | HR 60 | Temp 97.6°F | Resp 20 | Ht 63.25 in | Wt 132.5 lb

## 2023-11-06 DIAGNOSIS — Z113 Encounter for screening for infections with a predominantly sexual mode of transmission: Secondary | ICD-10-CM | POA: Diagnosis not present

## 2023-11-06 DIAGNOSIS — Z1151 Encounter for screening for human papillomavirus (HPV): Secondary | ICD-10-CM | POA: Insufficient documentation

## 2023-11-06 DIAGNOSIS — Z0000 Encounter for general adult medical examination without abnormal findings: Secondary | ICD-10-CM | POA: Diagnosis not present

## 2023-11-06 LAB — COMPREHENSIVE METABOLIC PANEL WITH GFR
ALT: 44 U/L — ABNORMAL HIGH (ref 0–35)
AST: 26 U/L (ref 0–37)
Albumin: 4.3 g/dL (ref 3.5–5.2)
Alkaline Phosphatase: 51 U/L (ref 39–117)
BUN: 17 mg/dL (ref 6–23)
CO2: 30 meq/L (ref 19–32)
Calcium: 9.2 mg/dL (ref 8.4–10.5)
Chloride: 105 meq/L (ref 96–112)
Creatinine, Ser: 0.76 mg/dL (ref 0.40–1.20)
GFR: 92.61 mL/min (ref >60.00)
Glucose, Bld: 78 mg/dL (ref 70–99)
Potassium: 3.9 meq/L (ref 3.5–5.1)
Sodium: 140 meq/L (ref 135–145)
Total Bilirubin: 1 mg/dL (ref 0.2–1.2)
Total Protein: 6.6 g/dL (ref 6.0–8.3)

## 2023-11-06 LAB — CBC WITH DIFFERENTIAL/PLATELET
Basophils Absolute: 0 K/uL (ref 0.0–0.1)
Basophils Relative: 0.7 % (ref 0.0–3.0)
Eosinophils Absolute: 0.1 K/uL (ref 0.0–0.7)
Eosinophils Relative: 1.3 % (ref 0.0–5.0)
HCT: 38.8 % (ref 36.0–46.0)
Hemoglobin: 12.9 g/dL (ref 12.0–15.0)
Lymphocytes Relative: 32.7 % (ref 12.0–46.0)
Lymphs Abs: 1.5 K/uL (ref 0.7–4.0)
MCHC: 33.2 g/dL (ref 30.0–36.0)
MCV: 97.4 fl (ref 78.0–100.0)
Monocytes Absolute: 0.3 K/uL (ref 0.1–1.0)
Monocytes Relative: 6.4 % (ref 3.0–12.0)
Neutro Abs: 2.7 K/uL (ref 1.4–7.7)
Neutrophils Relative %: 58.9 % (ref 43.0–77.0)
Platelets: 269 K/uL (ref 150.0–400.0)
RBC: 3.98 Mil/uL (ref 3.87–5.11)
RDW: 13 % (ref 11.5–15.5)
WBC: 4.6 K/uL (ref 4.0–10.5)

## 2023-11-06 LAB — LIPID PANEL
Cholesterol: 188 mg/dL (ref 0–200)
HDL: 54.2 mg/dL (ref >39.00)
LDL Cholesterol: 123 mg/dL — ABNORMAL HIGH (ref 0–99)
NonHDL: 134
Total CHOL/HDL Ratio: 3
Triglycerides: 56 mg/dL (ref 0.0–149.0)
VLDL: 11.2 mg/dL (ref 0.0–40.0)

## 2023-11-06 LAB — TSH: TSH: 1.39 u[IU]/mL (ref 0.35–5.50)

## 2023-11-06 LAB — VITAMIN D 25 HYDROXY (VIT D DEFICIENCY, FRACTURES): VITD: 17.27 ng/mL — ABNORMAL LOW (ref 30.00–100.00)

## 2023-11-06 LAB — HEMOGLOBIN A1C: Hgb A1c MFr Bld: 5.6 % (ref 4.6–6.5)

## 2023-11-06 NOTE — Assessment & Plan Note (Addendum)
 Congratulate patient to diligence to lifestyle changes and weight loss.  Pap smear obtained today.  Discussed previous colposcopy likely impacting collection and that cervical os is quite stenotic on exam likely from scaring and perimenopause.

## 2023-11-06 NOTE — Progress Notes (Signed)
 Assessment & Plan:  Annual physical exam Assessment & Plan: Congratulate patient to diligence to lifestyle changes and weight loss.  Pap smear obtained today.  Discussed previous colposcopy likely impacting collection and that cervical os is quite stenotic on exam likely from scaring and perimenopause.  Orders: -     Cytology - PAP -     TSH -     CBC with Differential/Platelet -     Comprehensive metabolic panel with GFR -     Hemoglobin A1c -     Lipid panel -     VITAMIN D  25 Hydroxy (Vit-D Deficiency, Fractures)     Return precautions given.   Risks, benefits, and alternatives of the medications and treatment plan prescribed today were discussed, and patient expressed understanding.   Education regarding symptom management and diagnosis given to patient on AVS either electronically or printed.  No follow-ups on file.  Rollene Northern, FNP  Subjective:    Patient ID: Christine Petty, female    DOB: Apr 26, 1975, 48 y.o.   MRN: [id number]  CC: [name] is a 48 y.o. female who presents today for physical exam.    HPI: She has lost weight , approx 50lbs, intentionally through exercise. She is eating healthier.  She is pleased with her current weight and is working to maintain.     H/o BCC, melanoma; follows with Dr Kowalski q27mos  Colorectal Cancer Screening: UTD , Dr Legrand, repeat in 10 years Breast Cancer Screening: Mammogram UTD Cervical Cancer Screening: UTD, 09/03/22 neg HPV, NILM; transformation zone absent.  She reports history of colposcopy Bone Health screening/DEXA for 65+: No increased fracture risk. Defer screening at this time.        Tetanus - UTD          Exercise: Gets regular exercise with complete fitness and spin approx 5 days/wk.   Alcohol use:  rare Smoking/tobacco use: Nonsmoker.    Health Maintenance  Topic Date Due   COVID-19 Vaccine (3 - Pfizer risk series) 04/07/2019   Pap Smear  09/03/2023   Flu Shot  05/26/2024*   Colon Cancer  Screening  09/03/2030   DTaP/Tdap/Td vaccine (2 - Td or Tdap) 07/13/2031   Hepatitis C Screening  Completed   HIV Screening  Completed   Pneumococcal Vaccine  Aged Out   HPV Vaccine  Aged Out   Meningitis B Vaccine  Aged Out   Hepatitis B Vaccine  Discontinued  *Topic was postponed. The date shown is not the original due date.    ALLERGIES: Patient has no known allergies.  Current Outpatient Medications on File Prior to Visit  Medication Sig Dispense Refill   buPROPion  (WELLBUTRIN  XL) 300 MG 24 hr tablet Take 1 tablet (300 mg total) by mouth every morning. 90 tablet 3   traZODone  (DESYREL ) 50 MG tablet Take 0.5-1 tablets (25-50 mg total) by mouth at bedtime as needed for sleep. 90 tablet 3   tretinoin  (RETIN-A ) 0.025 % cream Apply to face at bedtime 45 g 11   fluorouracil  (EFUDEX ) 5 % cream Apply topically 2 (two) times daily. Apply to affected area on the forehead twice a day for 7 days then stop 30 g 2   No current facility-administered medications on file prior to visit.    Review of Systems  Constitutional:  Negative for chills and fever.  Respiratory:  Negative for cough.   Cardiovascular:  Negative for chest pain and palpitations.  Gastrointestinal:  Negative for nausea and vomiting.  Objective:    BP 94/60   Pulse 60   Temp 97.6 F (36.4 C)   Resp 20   Ht 5' 3.25 (1.607 m)   Wt 132 lb 8 oz (60.1 kg)   LMP 11/01/2023   SpO2 99%   BMI 23.29 kg/m   BP Readings from Last 3 Encounters:  11/06/23 94/60  09/03/22 114/72  07/18/22 118/70   Wt Readings from Last 3 Encounters:  11/06/23 132 lb 8 oz (60.1 kg)  09/03/22 173 lb 4 oz (78.6 kg)  07/18/22 186 lb (84.4 kg)    Physical Exam Vitals reviewed.  Constitutional:      Appearance: Normal appearance. She is well-developed.  Eyes:     Conjunctiva/sclera: Conjunctivae normal.  Neck:     Thyroid : No thyroid  mass or thyromegaly.  Cardiovascular:     Rate and Rhythm: Normal rate and regular rhythm.      Pulses: Normal pulses.     Heart sounds: Normal heart sounds.  Pulmonary:     Effort: Pulmonary effort is normal.     Breath sounds: Normal breath sounds. No wheezing, rhonchi or rales.  Chest:  Breasts:    Breasts are symmetrical.     Right: No inverted nipple, mass, nipple discharge, skin change or tenderness.     Left: No inverted nipple, mass, nipple discharge, skin change or tenderness.  Abdominal:     General: Bowel sounds are normal. There is no distension.     Palpations: Abdomen is soft. Abdomen is not rigid. There is no fluid wave or mass.     Tenderness: There is no abdominal tenderness. There is no guarding or rebound.  Genitourinary:    Cervix: No cervical motion tenderness, discharge, friability, lesion, erythema, cervical bleeding or eversion.     Uterus: Not enlarged, not fixed and not tender.      Adnexa:        Right: No mass, tenderness or fullness.         Left: No mass, tenderness or fullness.       Comments: Pap performed. Cervical os profoundly stenotic.  No CMT. Unable to appreciated ovaries. Lymphadenopathy:     Head:     Right side of head: No submental, submandibular, tonsillar, preauricular, posterior auricular or occipital adenopathy.     Left side of head: No submental, submandibular, tonsillar, preauricular, posterior auricular or occipital adenopathy.     Cervical:     Right cervical: No superficial, deep or posterior cervical adenopathy.    Left cervical: No superficial, deep or posterior cervical adenopathy.     Upper Body:     Right upper body: No pectoral adenopathy.     Left upper body: No pectoral adenopathy.  Skin:    General: Skin is warm and dry.  Neurological:     Mental Status: She is alert.  Psychiatric:        Speech: Speech normal.        Behavior: Behavior normal.        Thought Content: Thought content normal.

## 2023-11-06 NOTE — Patient Instructions (Signed)

## 2023-11-07 LAB — CYTOLOGY - PAP
Chlamydia: NEGATIVE
Comment: NEGATIVE
Comment: NEGATIVE
Comment: NEGATIVE
Comment: NORMAL
Diagnosis: NEGATIVE
High risk HPV: NEGATIVE
Neisseria Gonorrhea: NEGATIVE
Trichomonas: NEGATIVE

## 2023-11-15 ENCOUNTER — Other Ambulatory Visit: Payer: Self-pay

## 2023-11-15 ENCOUNTER — Ambulatory Visit: Payer: Self-pay | Admitting: Family

## 2023-11-15 DIAGNOSIS — E559 Vitamin D deficiency, unspecified: Secondary | ICD-10-CM

## 2023-11-15 DIAGNOSIS — Z0000 Encounter for general adult medical examination without abnormal findings: Secondary | ICD-10-CM

## 2023-11-15 MED ORDER — CHOLECALCIFEROL 1.25 MG (50000 UT) PO CAPS
50000.0000 [IU] | ORAL_CAPSULE | ORAL | 0 refills | Status: AC
  Filled 2023-11-15: qty 8, 56d supply, fill #0

## 2023-12-04 ENCOUNTER — Other Ambulatory Visit: Payer: Self-pay

## 2023-12-04 ENCOUNTER — Other Ambulatory Visit (INDEPENDENT_AMBULATORY_CARE_PROVIDER_SITE_OTHER)

## 2023-12-04 DIAGNOSIS — Z0000 Encounter for general adult medical examination without abnormal findings: Secondary | ICD-10-CM | POA: Diagnosis not present

## 2023-12-04 NOTE — Addendum Note (Signed)
 Addended by: MARYLEN PRO A on: 12/04/2023 02:13 PM   Modules accepted: Orders

## 2023-12-05 ENCOUNTER — Ambulatory Visit: Payer: Self-pay | Admitting: Family

## 2023-12-05 ENCOUNTER — Other Ambulatory Visit: Payer: Self-pay

## 2023-12-05 LAB — COMPREHENSIVE METABOLIC PANEL WITH GFR
ALT: 30 IU/L (ref 0–32)
AST: 25 IU/L (ref 0–40)
Albumin: 4.3 g/dL (ref 3.9–4.9)
Alkaline Phosphatase: 57 IU/L (ref 41–116)
BUN/Creatinine Ratio: 26 — ABNORMAL HIGH (ref 9–23)
BUN: 19 mg/dL (ref 6–24)
Bilirubin Total: 1.1 mg/dL (ref 0.0–1.2)
CO2: 24 mmol/L (ref 20–29)
Calcium: 9.4 mg/dL (ref 8.7–10.2)
Chloride: 103 mmol/L (ref 96–106)
Creatinine, Ser: 0.74 mg/dL (ref 0.57–1.00)
Globulin, Total: 2.1 g/dL (ref 1.5–4.5)
Glucose: 80 mg/dL (ref 70–99)
Potassium: 4.1 mmol/L (ref 3.5–5.2)
Sodium: 141 mmol/L (ref 134–144)
Total Protein: 6.4 g/dL (ref 6.0–8.5)
eGFR: 100 mL/min/1.73 (ref >59)

## 2024-02-24 ENCOUNTER — Other Ambulatory Visit: Payer: Self-pay

## 2024-03-11 ENCOUNTER — Encounter: Payer: Self-pay | Admitting: Dermatology

## 2024-03-11 ENCOUNTER — Ambulatory Visit: Admitting: Dermatology

## 2024-03-11 DIAGNOSIS — D225 Melanocytic nevi of trunk: Secondary | ICD-10-CM

## 2024-03-11 DIAGNOSIS — D485 Neoplasm of uncertain behavior of skin: Secondary | ICD-10-CM

## 2024-03-11 DIAGNOSIS — D2271 Melanocytic nevi of right lower limb, including hip: Secondary | ICD-10-CM

## 2024-03-11 DIAGNOSIS — Z85828 Personal history of other malignant neoplasm of skin: Secondary | ICD-10-CM

## 2024-03-11 DIAGNOSIS — L814 Other melanin hyperpigmentation: Secondary | ICD-10-CM

## 2024-03-11 DIAGNOSIS — W908XXA Exposure to other nonionizing radiation, initial encounter: Secondary | ICD-10-CM | POA: Diagnosis not present

## 2024-03-11 DIAGNOSIS — D229 Melanocytic nevi, unspecified: Secondary | ICD-10-CM

## 2024-03-11 DIAGNOSIS — D2261 Melanocytic nevi of right upper limb, including shoulder: Secondary | ICD-10-CM

## 2024-03-11 DIAGNOSIS — L578 Other skin changes due to chronic exposure to nonionizing radiation: Secondary | ICD-10-CM

## 2024-03-11 DIAGNOSIS — Z1283 Encounter for screening for malignant neoplasm of skin: Secondary | ICD-10-CM

## 2024-03-11 DIAGNOSIS — L821 Other seborrheic keratosis: Secondary | ICD-10-CM | POA: Diagnosis not present

## 2024-03-11 DIAGNOSIS — Z79899 Other long term (current) drug therapy: Secondary | ICD-10-CM

## 2024-03-11 DIAGNOSIS — Z85820 Personal history of malignant melanoma of skin: Secondary | ICD-10-CM

## 2024-03-11 DIAGNOSIS — Z7189 Other specified counseling: Secondary | ICD-10-CM

## 2024-03-11 DIAGNOSIS — L988 Other specified disorders of the skin and subcutaneous tissue: Secondary | ICD-10-CM | POA: Diagnosis not present

## 2024-03-11 DIAGNOSIS — Z86018 Personal history of other benign neoplasm: Secondary | ICD-10-CM

## 2024-03-11 NOTE — Progress Notes (Signed)
 "  Follow-Up Visit   Subjective  Christine Petty is a 49 y.o. female who presents for the following: Skin Cancer Screening and Full Body Skin Exam  The patient presents for Total-Body Skin Exam (TBSE) for skin cancer screening and mole check. The patient has spots, moles and lesions to be evaluated, some may be new or changing and the patient may have concern these could be cancer.  The following portions of the chart were reviewed this encounter and updated as appropriate: medications, allergies, medical history  Review of Systems:  No other skin or systemic complaints except as noted in HPI or Assessment and Plan.  Objective  Well appearing patient in no apparent distress; mood and affect are within normal limits.  A full examination was performed including scalp, head, eyes, ears, nose, lips, neck, chest, axillae, abdomen, back, buttocks, bilateral upper extremities, bilateral lower extremities, hands, feet, fingers, toes, fingernails, and toenails. All findings within normal limits unless otherwise noted below.   Relevant physical exam findings are noted in the Assessment and Plan.  R med inframammary 0.7 x 0.4 cm irregular brown macule.  R post deltoid 1.0 x 0.7 cm irregular brown macule.  R post calf 0.4 cm irregular brown macule.   Assessment & Plan   SKIN CANCER SCREENING PERFORMED TODAY.  ACTINIC DAMAGE - Chronic condition, secondary to cumulative UV/sun exposure - diffuse scaly erythematous macules with underlying dyspigmentation - Recommend daily broad spectrum sunscreen SPF 30+ to sun-exposed areas, reapply every 2 hours as needed.  - Staying in the shade or wearing long sleeves, sun glasses (UVA+UVB protection) and wide brim hats (4-inch brim around the entire circumference of the hat) are also recommended for sun protection.  - Call for new or changing lesions.  LENTIGINES, SEBORRHEIC KERATOSES, HEMANGIOMAS - Benign normal skin lesions - Benign-appearing - Call  for any changes  MELANOCYTIC NEVI - R med inframammary 0.7 x 0.4 cm - R post deltoid 1.0 x 0.7 cm  - Tan-brown and/or pink-flesh-colored symmetric macules and papules - Benign appearing on exam today - Observation - Call clinic for new or changing moles - Recommend daily use of broad spectrum spf 30+ sunscreen to sun-exposed areas.   HISTORY OF BASAL CELL CARCINOMA OF THE SKIN - multiple, see hx - No evidence of recurrence today - Recommend regular full body skin exams - Recommend daily broad spectrum sunscreen SPF 30+ to sun-exposed areas, reapply every 2 hours as needed.  - Call if any new or changing lesions are noted between office visits  HISTORY OF MELANOMA - R lat neck WLE 2010, neg sentinel node  Clark's level IV, Breslow's 0.23mm  - No evidence of recurrence today - No lymphadenopathy - Recommend regular full body skin exams - Recommend daily broad spectrum sunscreen SPF 30+ to sun-exposed areas, reapply every 2 hours as needed.  - Call if any new or changing lesions are noted between office visits  HISTORY OF DYSPLASTIC NEVI - multiple, see hx No evidence of recurrence today Recommend regular full body skin exams Recommend daily broad spectrum sunscreen SPF 30+ to sun-exposed areas, reapply every 2 hours as needed.  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 Only apply a pea-sized amount to the entire affected area. Avoid applying it around  the eyes, edges of mouth and creases at the nose. If you experience irritation, use a good moisturizer first and/or apply the medicine less often. If you are doing well with the medicine, you can increase how often you use it until you are  applying every night. Be careful with sun protection while using this medication as it can make you sensitive to the sun. This medicine should not be used by pregnant women.  NEOPLASM OF UNCERTAIN BEHAVIOR OF SKIN (3) R med inframammary - Epidermal / dermal shaving  Lesion diameter (cm):  0.7 Informed consent: discussed and consent obtained   Timeout: patient name, date of birth, surgical site, and procedure verified   Procedure prep:  Patient was prepped and draped in usual sterile fashion Prep type:  Isopropyl alcohol Anesthesia: the lesion was anesthetized in a standard fashion   Anesthetic:  1% lidocaine w/ epinephrine 1-100,000 buffered w/ 8.4% NaHCO3 Instrument used: flexible razor blade   Hemostasis achieved with: pressure, aluminum chloride and electrodesiccation   Outcome: patient tolerated procedure well   Post-procedure details: sterile dressing applied and wound care instructions given   Dressing type: bandage (Mupirocin  2% ointment)    Specimen 1 - Surgical pathology Differential Diagnosis: D48.5 r/o dysplastic nevus Check Margins: Yes R post deltoid - Epidermal / dermal shaving  Lesion diameter (cm):  1 Informed consent: discussed and consent obtained   Timeout: patient name, date of birth, surgical site, and procedure verified   Procedure prep:  Patient was prepped and draped in usual sterile fashion Prep type:  Isopropyl alcohol Anesthesia: the lesion was anesthetized in a standard fashion   Anesthetic:  1% lidocaine w/ epinephrine 1-100,000 buffered w/ 8.4% NaHCO3 Instrument used: flexible razor blade   Hemostasis achieved with: pressure, aluminum chloride and electrodesiccation   Outcome: patient tolerated procedure well   Post-procedure details: sterile dressing applied and wound care instructions given   Dressing type: bandage (Mupirocin  2% ointment)    Specimen 2 - Surgical pathology Differential Diagnosis: D48.5 r/o dysplastic nevus Check Margins: Yes R  post calf - Epidermal / dermal shaving  Lesion diameter (cm):  0.4 Informed consent: discussed and consent obtained   Timeout: patient name, date of birth, surgical site, and procedure verified   Procedure prep:  Patient was prepped and draped in usual sterile fashion Prep type:  Isopropyl alcohol Anesthesia: the lesion was anesthetized in a standard fashion   Anesthetic:  1% lidocaine w/ epinephrine 1-100,000 buffered w/ 8.4% NaHCO3 Instrument used: flexible razor blade   Hemostasis achieved with: pressure, aluminum chloride and electrodesiccation   Outcome: patient tolerated procedure well   Post-procedure details: sterile dressing applied and wound care instructions given   Dressing type: bandage (Mupirocin  2% ointment)    Specimen 3 - Surgical pathology Differential Diagnosis: D48.5 r/o dysplastic nevus Check Margins: Yes  Return in about 6 months (around 09/08/2024) for TBSE - hx MM, BCC, dysplastic nevi.  Christine Petty, CMA, am acting as scribe for Alm Rhyme, MD .   Documentation: I have reviewed the above documentation for accuracy and completeness, and I agree with the above.  Alm Rhyme, MD    "

## 2024-03-11 NOTE — Patient Instructions (Addendum)

## 2024-03-17 LAB — SURGICAL PATHOLOGY

## 2024-03-18 ENCOUNTER — Ambulatory Visit: Payer: Self-pay | Admitting: Dermatology

## 2024-03-18 ENCOUNTER — Encounter: Payer: Self-pay | Admitting: Dermatology

## 2024-03-18 DIAGNOSIS — D239 Other benign neoplasm of skin, unspecified: Secondary | ICD-10-CM

## 2024-03-18 DIAGNOSIS — D229 Melanocytic nevi, unspecified: Secondary | ICD-10-CM

## 2024-03-18 NOTE — Addendum Note (Signed)
 Addended by: TANDA SETTER A on: 03/18/2024 02:00 PM   Modules accepted: Orders

## 2024-03-18 NOTE — Telephone Encounter (Addendum)
 Tried calling patient regarding results and to schedule treatment. No answer. Lm for patient to return call.   ----- Message from Alm Rhyme, MD sent at 03/18/2024 11:13 AM EST ----- FINAL DIAGNOSIS        1. Skin, R med inframammary :       DYSPLASTIC COMPOUND NEVUS WITH SEVERE ATYPIA, PERIPHERAL AND DEEP MARGINS       INVOLVED, SEE DESCRIPTION        2. Skin, R post deltoid :       DYSPLASTIC COMPOUND NEVUS WITH MODERATE ATYPIA, DEEP MARGIN INVOLVED        3. 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  I suggest Dr Corey as #3 may best be treated with [facility]. Pt with history of Melanoma

## 2024-03-18 NOTE — Telephone Encounter (Addendum)
 Patient returned call. Went over results and treatment recommendations per Dr. Hester. Patient ok with having treatment with Dr. Corey in [place].  Referral sent to Dr. Paci for Mohs to treat #1 and # 3.   ----Message from Alm Hester, MD sent at 03/18/2024 11:13 AM EST ----- FINAL DIAGNOSIS        1. Skin, R med inframammary :       DYSPLASTIC COMPOUND NEVUS WITH SEVERE ATYPIA, PERIPHERAL AND DEEP MARGINS       INVOLVED, SEE DESCRIPTION        2. Skin, R post deltoid :       DYSPLASTIC COMPOUND NEVUS WITH MODERATE ATYPIA, DEEP MARGIN INVOLVED        3. 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  I suggest Dr Corey as #3 may best be treated with [facility]. Pt with history of Melanoma

## 2024-04-13 ENCOUNTER — Encounter: Admitting: Dermatology

## 2024-05-13 ENCOUNTER — Encounter: Admitting: Dermatology

## 2024-09-16 ENCOUNTER — Ambulatory Visit: Admitting: Dermatology
